# Patient Record
Sex: Male | Born: 1960 | Race: White | Hispanic: No | Marital: Married | Smoking: Never smoker
Health system: Southern US, Community
[De-identification: ages and names within clinical notes are randomized; demographics above are authoritative.]

## PROBLEM LIST (undated history)

## (undated) DIAGNOSIS — E785 Hyperlipidemia, unspecified: Secondary | ICD-10-CM

## (undated) DIAGNOSIS — R933 Abnormal findings on diagnostic imaging of other parts of digestive tract: Secondary | ICD-10-CM

## (undated) DIAGNOSIS — K621 Rectal polyp: Secondary | ICD-10-CM

## (undated) DIAGNOSIS — L570 Actinic keratosis: Secondary | ICD-10-CM

## (undated) DIAGNOSIS — N410 Acute prostatitis: Secondary | ICD-10-CM

## (undated) DIAGNOSIS — F411 Generalized anxiety disorder: Secondary | ICD-10-CM

## (undated) DIAGNOSIS — K620 Anal polyp: Secondary | ICD-10-CM

## (undated) DIAGNOSIS — C4490 Unspecified malignant neoplasm of skin, unspecified: Secondary | ICD-10-CM

## (undated) HISTORY — DX: Abnormal findings on diagnostic imaging of other parts of digestive tract: R93.3

## (undated) HISTORY — DX: Anal polyp: K62.0

## (undated) HISTORY — DX: Unspecified malignant neoplasm of skin, unspecified: C44.90

## (undated) HISTORY — DX: Acute prostatitis: N41.0

## (undated) HISTORY — DX: Hyperlipidemia, unspecified: E78.5

## (undated) HISTORY — DX: Actinic keratosis: L57.0

## (undated) HISTORY — DX: Rectal polyp: K62.1

## (undated) HISTORY — DX: Generalized anxiety disorder: F41.1

---

## 2001-12-08 HISTORY — PX: APPENDECTOMY: SHX54

## 2002-06-23 ENCOUNTER — Encounter (INDEPENDENT_AMBULATORY_CARE_PROVIDER_SITE_OTHER): Payer: Self-pay | Admitting: *Deleted

## 2002-06-23 ENCOUNTER — Encounter: Payer: Self-pay | Admitting: Family Medicine

## 2002-06-23 ENCOUNTER — Inpatient Hospital Stay (HOSPITAL_COMMUNITY): Admission: EM | Admit: 2002-06-23 | Discharge: 2002-06-25 | Payer: Self-pay | Admitting: *Deleted

## 2002-06-23 ENCOUNTER — Encounter: Admission: RE | Admit: 2002-06-23 | Discharge: 2002-06-23 | Payer: Self-pay | Admitting: Family Medicine

## 2004-06-20 ENCOUNTER — Emergency Department (HOSPITAL_COMMUNITY): Admission: EM | Admit: 2004-06-20 | Discharge: 2004-06-20 | Payer: Self-pay | Admitting: Emergency Medicine

## 2005-02-06 ENCOUNTER — Ambulatory Visit: Payer: Self-pay | Admitting: Gastroenterology

## 2005-02-20 ENCOUNTER — Ambulatory Visit: Payer: Self-pay | Admitting: Gastroenterology

## 2008-05-09 ENCOUNTER — Encounter: Payer: Self-pay | Admitting: Family Medicine

## 2009-03-06 ENCOUNTER — Ambulatory Visit: Payer: Self-pay | Admitting: Family Medicine

## 2009-03-06 LAB — CONVERTED CEMR LAB
Bilirubin Urine: NEGATIVE
Blood in Urine, dipstick: NEGATIVE
Glucose, Urine, Semiquant: NEGATIVE
Ketones, urine, test strip: NEGATIVE
Nitrite: NEGATIVE
Protein, U semiquant: NEGATIVE
Specific Gravity, Urine: 1.01
Urobilinogen, UA: 0.2
WBC Urine, dipstick: NEGATIVE
pH: 7

## 2009-03-07 LAB — CONVERTED CEMR LAB
ALT: 42 units/L (ref 0–53)
AST: 30 units/L (ref 0–37)
Albumin: 4.2 g/dL (ref 3.5–5.2)
Alkaline Phosphatase: 81 units/L (ref 39–117)
BUN: 9 mg/dL (ref 6–23)
Basophils Absolute: 0 10*3/uL (ref 0.0–0.1)
Basophils Relative: 0.2 % (ref 0.0–3.0)
Bilirubin, Direct: 0 mg/dL (ref 0.0–0.3)
CO2: 29 meq/L (ref 19–32)
Calcium: 9.3 mg/dL (ref 8.4–10.5)
Chloride: 105 meq/L (ref 96–112)
Cholesterol: 235 mg/dL — ABNORMAL HIGH (ref 0–200)
Creatinine, Ser: 0.8 mg/dL (ref 0.4–1.5)
Direct LDL: 169.1 mg/dL
Eosinophils Absolute: 0.1 10*3/uL (ref 0.0–0.7)
Eosinophils Relative: 1.9 % (ref 0.0–5.0)
GFR calc non Af Amer: 110.41 mL/min (ref >60)
Glucose, Bld: 101 mg/dL — ABNORMAL HIGH (ref 70–99)
HCT: 43.1 % (ref 39.0–52.0)
HDL: 46.4 mg/dL (ref >39.00)
Hemoglobin: 15 g/dL (ref 13.0–17.0)
Lymphocytes Relative: 47.4 % — ABNORMAL HIGH (ref 12.0–46.0)
Lymphs Abs: 2.8 10*3/uL (ref 0.7–4.0)
MCHC: 34.7 g/dL (ref 30.0–36.0)
MCV: 92.9 fL (ref 78.0–100.0)
Monocytes Absolute: 0.5 10*3/uL (ref 0.1–1.0)
Monocytes Relative: 9.3 % (ref 3.0–12.0)
Neutro Abs: 2.4 10*3/uL (ref 1.4–7.7)
Neutrophils Relative %: 41.2 % — ABNORMAL LOW (ref 43.0–77.0)
Platelets: 254 10*3/uL (ref 150.0–400.0)
Potassium: 4.1 meq/L (ref 3.5–5.1)
RBC: 4.64 M/uL (ref 4.22–5.81)
RDW: 11.4 % — ABNORMAL LOW (ref 11.5–14.6)
Sodium: 140 meq/L (ref 135–145)
TSH: 1.66 microintl units/mL (ref 0.35–5.50)
Total Bilirubin: 1.2 mg/dL (ref 0.3–1.2)
Total CHOL/HDL Ratio: 5
Total Protein: 7.4 g/dL (ref 6.0–8.3)
Triglycerides: 107 mg/dL (ref 0.0–149.0)
VLDL: 21.4 mg/dL (ref 0.0–40.0)
WBC: 5.8 10*3/uL (ref 4.5–10.5)

## 2009-03-16 ENCOUNTER — Ambulatory Visit: Payer: Self-pay | Admitting: Family Medicine

## 2009-03-19 DIAGNOSIS — J309 Allergic rhinitis, unspecified: Secondary | ICD-10-CM | POA: Insufficient documentation

## 2009-05-01 ENCOUNTER — Ambulatory Visit: Payer: Self-pay | Admitting: Family Medicine

## 2009-05-10 DIAGNOSIS — C4490 Unspecified malignant neoplasm of skin, unspecified: Secondary | ICD-10-CM

## 2009-05-10 DIAGNOSIS — E785 Hyperlipidemia, unspecified: Secondary | ICD-10-CM

## 2009-05-10 HISTORY — DX: Unspecified malignant neoplasm of skin, unspecified: C44.90

## 2009-05-10 HISTORY — DX: Hyperlipidemia, unspecified: E78.5

## 2009-10-12 ENCOUNTER — Ambulatory Visit: Payer: Self-pay | Admitting: Family Medicine

## 2009-10-12 DIAGNOSIS — F411 Generalized anxiety disorder: Secondary | ICD-10-CM | POA: Insufficient documentation

## 2009-10-12 DIAGNOSIS — M545 Low back pain, unspecified: Secondary | ICD-10-CM | POA: Insufficient documentation

## 2009-10-12 DIAGNOSIS — L570 Actinic keratosis: Secondary | ICD-10-CM

## 2009-10-12 HISTORY — DX: Generalized anxiety disorder: F41.1

## 2009-10-12 HISTORY — DX: Actinic keratosis: L57.0

## 2009-10-17 ENCOUNTER — Encounter: Admission: RE | Admit: 2009-10-17 | Discharge: 2009-10-17 | Payer: Self-pay | Admitting: Family Medicine

## 2009-12-13 ENCOUNTER — Ambulatory Visit: Payer: Self-pay | Admitting: Family Medicine

## 2010-01-29 ENCOUNTER — Encounter (INDEPENDENT_AMBULATORY_CARE_PROVIDER_SITE_OTHER): Payer: Self-pay | Admitting: *Deleted

## 2010-02-21 ENCOUNTER — Telehealth: Payer: Self-pay | Admitting: Family Medicine

## 2010-03-19 ENCOUNTER — Ambulatory Visit: Payer: Self-pay | Admitting: Family Medicine

## 2010-03-19 LAB — CONVERTED CEMR LAB
BUN: 7 mg/dL (ref 6–23)
Basophils Absolute: 0 10*3/uL (ref 0.0–0.1)
Chloride: 105 meq/L (ref 96–112)
Cholesterol: 249 mg/dL — ABNORMAL HIGH (ref 0–200)
Eosinophils Absolute: 0.2 10*3/uL (ref 0.0–0.7)
GFR calc non Af Amer: 109.92 mL/min (ref >60)
Glucose, Bld: 87 mg/dL (ref 70–99)
HCT: 42.8 % (ref 39.0–52.0)
HDL: 54 mg/dL (ref >39.00)
Ketones, urine, test strip: NEGATIVE
Lymphs Abs: 2.5 10*3/uL (ref 0.7–4.0)
MCV: 92.7 fL (ref 78.0–100.0)
Monocytes Absolute: 0.5 10*3/uL (ref 0.1–1.0)
Nitrite: NEGATIVE
Platelets: 254 10*3/uL (ref 150.0–400.0)
Potassium: 3.9 meq/L (ref 3.5–5.1)
RDW: 12.9 % (ref 11.5–14.6)
TSH: 2.48 microintl units/mL (ref 0.35–5.50)
Total Bilirubin: 0.7 mg/dL (ref 0.3–1.2)
Urobilinogen, UA: 0.2
VLDL: 25.6 mg/dL (ref 0.0–40.0)
WBC Urine, dipstick: NEGATIVE
pH: 6

## 2010-04-04 ENCOUNTER — Ambulatory Visit: Payer: Self-pay | Admitting: Family Medicine

## 2010-04-23 ENCOUNTER — Encounter (INDEPENDENT_AMBULATORY_CARE_PROVIDER_SITE_OTHER): Payer: Self-pay | Admitting: *Deleted

## 2010-05-16 ENCOUNTER — Encounter (INDEPENDENT_AMBULATORY_CARE_PROVIDER_SITE_OTHER): Payer: Self-pay

## 2010-05-21 ENCOUNTER — Ambulatory Visit: Payer: Self-pay | Admitting: Gastroenterology

## 2010-05-24 ENCOUNTER — Telehealth: Payer: Self-pay | Admitting: Family Medicine

## 2010-05-24 ENCOUNTER — Ambulatory Visit: Payer: Self-pay | Admitting: Family Medicine

## 2010-05-24 DIAGNOSIS — N410 Acute prostatitis: Secondary | ICD-10-CM | POA: Insufficient documentation

## 2010-05-24 HISTORY — DX: Acute prostatitis: N41.0

## 2010-05-24 LAB — CONVERTED CEMR LAB
Bilirubin Urine: NEGATIVE
Protein, U semiquant: NEGATIVE
Urobilinogen, UA: 0.2
WBC Urine, dipstick: NEGATIVE

## 2010-05-28 ENCOUNTER — Ambulatory Visit: Payer: Self-pay | Admitting: Family Medicine

## 2010-05-28 DIAGNOSIS — R197 Diarrhea, unspecified: Secondary | ICD-10-CM

## 2010-05-28 LAB — CONVERTED CEMR LAB
Bilirubin Urine: NEGATIVE
Ketones, urine, test strip: NEGATIVE
Urobilinogen, UA: 0.2
pH: 6

## 2010-05-29 ENCOUNTER — Telehealth: Payer: Self-pay | Admitting: Gastroenterology

## 2010-05-29 ENCOUNTER — Telehealth: Payer: Self-pay | Admitting: Family Medicine

## 2010-05-31 ENCOUNTER — Telehealth: Payer: Self-pay | Admitting: Gastroenterology

## 2010-06-18 ENCOUNTER — Ambulatory Visit: Payer: Self-pay | Admitting: Family Medicine

## 2010-06-18 DIAGNOSIS — K620 Anal polyp: Secondary | ICD-10-CM | POA: Insufficient documentation

## 2010-06-18 DIAGNOSIS — K621 Rectal polyp: Secondary | ICD-10-CM

## 2010-06-18 HISTORY — DX: Rectal polyp: K62.0

## 2010-06-19 ENCOUNTER — Telehealth: Payer: Self-pay | Admitting: Gastroenterology

## 2010-06-20 ENCOUNTER — Telehealth: Payer: Self-pay | Admitting: Gastroenterology

## 2010-06-20 ENCOUNTER — Ambulatory Visit: Payer: Self-pay | Admitting: Internal Medicine

## 2010-06-20 DIAGNOSIS — R933 Abnormal findings on diagnostic imaging of other parts of digestive tract: Secondary | ICD-10-CM

## 2010-06-20 HISTORY — DX: Abnormal findings on diagnostic imaging of other parts of digestive tract: R93.3

## 2010-06-25 ENCOUNTER — Ambulatory Visit: Payer: Self-pay | Admitting: Internal Medicine

## 2010-06-25 ENCOUNTER — Telehealth: Payer: Self-pay | Admitting: Internal Medicine

## 2010-06-26 ENCOUNTER — Encounter: Payer: Self-pay | Admitting: Internal Medicine

## 2010-07-02 LAB — HM COLONOSCOPY

## 2010-10-18 ENCOUNTER — Ambulatory Visit: Payer: Self-pay | Admitting: Family Medicine

## 2011-01-09 NOTE — Assessment & Plan Note (Signed)
Summary: painful hemmoroids/njr   Vital Signs:  Patient profile:   50 year old male Weight:      190 pounds Temp:     99.4 degrees F oral  Vitals Entered By: Sid Falcon LPN (October 18, 2010 3:13 PM)  History of Present Illness: Patient seen with acute issue of perianal pain which started approximately one week ago. Recent history is that he had a colonoscopy which was unremarkable with the exception of hyperplastic polyp. He had external hemorrhoidal skin tag which was removed by surgeon few months ago but healed uneventfully. He had one episode of bright red blood per rectum last week. Did have some preceding constipation. Pain is sharp and intermittent moderate at times. Improved slightly with tub bath.  Patient denies any diarrhea, abdominal pain, fever.  Allergies: No Known Drug Allergies  Past History:  Past Medical History: Last updated: 06/20/2010 Allergic rhinitis basal cell cancer of chest wall Hyperlipidemia Anxiety Disorder PMH reviewed for relevance  Physical Exam  General:  Well-developed,well-nourished,in no acute distress; alert,appropriate and cooperative throughout examination Lungs:  Normal respiratory effort, chest expands symmetrically. Lungs are clear to auscultation, no crackles or wheezes. Heart:  Normal rate and regular rhythm. S1 and S2 normal without gallop, murmur, click, rub or other extra sounds. Rectal:  no evidence for external skin tags. Patient has fairly large fissure at 12:00 position with no active bleeding. This is tender to palpation. No evidence for perianal abscess   Impression & Recommendations:  Problem # 1:  ANAL FISSURE (ICD-565.0) Assessment New continue with tub baths, measures to reduce constipation, and addition of diltiazem gel or diluted nitroglycerin with Vaseline for symptom relief if pain persists or worsens  Complete Medication List: 1)  Aleve 220 Mg Caps (Naproxen sodium) .... Prn 2)  Clonazepam 0.5 Mg Tbdp  (Clonazepam) .... One by mouth two times a day as needed 3)  Xylocaine Jelly 2 % Gel (Lidocaine hcl) .... Use on rectal area up to 3 times daily  Patient Instructions: 1)  You have an anal fissure 2)  Drink lots of fluids and fiber. 3)  Stool softener such as colace or senokot s 2 daily. 4)  Continue warm tub baths.   Orders Added: 1)  Est. Patient Level III [60454]

## 2011-01-09 NOTE — Progress Notes (Signed)
Summary: Procedure Change  Phone Note Other Incoming   Caller: Dr.Moore Summary of Call: Dr.Moore called Dr.Brodie about a patient of Dr.Kaplans that was seen by Willette Cluster NP this morning. Apparently this pt. is extremely anxious, cannot wait until 07-02-10 for Colonoscopy, per Dr.Moore. Dr.Moore requests that pt. procedure be moved to ASAP. Dr.Nehemiah Mcfarren is out of the office 06-24-10 through 07-02-10, so Dr.Brodie has offered to do the procedure on 06-25-10 at 10am in [facility]. All instructions updated w/pt. by phone. Pt. advised that he will remain a pt. of Dr.Freeman Borba for all future GI needs, he agrees.  Initial call taken by: Laureen Ochs LPN,  June 20, 2010 4:40 PM  Follow-up for Phone Call        ok Follow-up by: Louis Meckel MD,  June 21, 2010 9:51 AM

## 2011-01-09 NOTE — Assessment & Plan Note (Signed)
Summary: UTI?dm   Vital Signs:  Patient profile:   50 year old male Weight:      188 pounds (85.45 kg) O2 Sat:      95 % Temp:     98.0 degrees F (36.67 degrees C) oral Pulse rate:   125 / minute BP sitting:   120 / 80  (right arm) Cuff size:   regular  Vitals Entered By: Kathrynn Speed CMA (May 28, 2010 3:19 PM) CC: prostatitis problems, urgency for one week, nausesa, pressure Is Patient Diabetic? No   History of Present Illness: Patient in today with persistent lower abdominal and lower back pain and pressure. Notes no change in his urinary frequency or urgency. He works outside on heavy equipment and he notes increased pain/urgency when he is bouncing around on machinery. No f/c/myalgias/fatigue. Does occasionally have some hesitancy but no significant dysuria. He was started on Cipro on Friday and does note no change in symptoms. He says the symptoms feel like when he had prostatitis years ago. He also notes some low grade nausea for over a week and an increase in loose stool since starting Cipro. 2-3 loose stool with some mild gaseousness, no bloody or tarry stool. Denies any CP/palp/SOB.  Current Medications (verified): 1)  Aleve 220 Mg Caps (Naproxen Sodium) .... Prn 2)  Clonazepam 0.5 Mg Tbdp (Clonazepam) .... One By Mouth Two Times A Day As Needed 3)  Ciprofloxacin Hcl 500 Mg Tabs (Ciprofloxacin Hcl) .... One By Mouth Two Times A Day For 10 Days 4)  Urogesic-Blue 81.6 Mg Tabs (Methen-Hyosc-Meth Blue-Na Phos) .... One Tab 4 Times A Day Prn  Allergies (verified): No Known Drug Allergies  Past History:  Past medical history reviewed for relevance to current acute and chronic problems. Social history (including risk factors) reviewed for relevance to current acute and chronic problems.  Past Medical History: Reviewed history from 05/10/2009 and no changes required. Allergic rhinitis basal cell cancer of chest wall Hyperlipidemia  Social History: Reviewed history from  03/16/2009 and no changes required. Occupation: Administrator,  Personal assistant Married Never Smoked Alcohol use-yes  Review of Systems      See HPI  Physical Exam  General:  Well-developed,well-nourished,in no acute distress; alert,appropriate and cooperative throughout examination Head:  Normocephalic and atraumatic without obvious abnormalities. No apparent alopecia or balding. Mouth:  Oral mucosa and oropharynx without lesions or exudates Lungs:  Normal respiratory effort, chest expands symmetrically. Lungs are clear to auscultation, no crackles or wheezes. Heart:  Normal rate and regular rhythm. S1 and S2 normal without gallop, murmur, click, rub or other extra sounds. Abdomen:  Bowel sounds positive,abdomen soft and non-tender without masses, organomegaly or hernias noted. Extremities:  No clubbing, cyanosis, edema, or deformity noted with normal full range of motion of all joints.   Psych:  Cognition and judgment appear intact. Alert and cooperative with normal attention span and concentration. No apparent delusions, illusions, hallucinations   Impression & Recommendations:  Problem # 1:  ACUTE PROSTATITIS (ICD-601.0) Will switch to Levaquin 500mg  once daily from Cipro. Use Naproxen 500mg  two times a day for pain, maintain increased hydration. Report if symptoms worsen to consider urologic referral.  Problem # 2:  DIARRHEA (ICD-787.91) Start Align probiotic daily and maintain adequate dietary fiber and increased hydration  Complete Medication List: 1)  Aleve 220 Mg Caps (Naproxen sodium) .... Prn 2)  Clonazepam 0.5 Mg Tbdp (Clonazepam) .... One by mouth two times a day as needed 3)  Urogesic-blue 81.6 Mg Tabs (Methen-hyosc-meth blue-na  phos) .... One tab 4 times a day prn 4)  Levaquin 500 Mg Tabs (Levofloxacin) .Marland Kitchen.. 1 tab by mouth daily x 14 days 5)  Naproxen 500 Mg Tabs (Naproxen) .Marland Kitchen.. 1 tab by mouth two times a day as needed for pain  Patient Instructions: 1)  Please schedule  a follow-up appointment as needed if symptoms worsen or do not resolve. Push clear fluids over the next few days, minimize caffeine and alcohol over the next few days. Use Naproxen as needed, start Align probiotic daily for next month. Prescriptions: NAPROXEN 500 MG TABS (NAPROXEN) 1 tab by mouth two times a day as needed for pain  #60 x 1   Entered and Authorized by:   Danise Edge MD   Signed by:   Danise Edge MD on 05/28/2010   Method used:   Faxed to ...       [facility]ospital doctor (retail)       [address]       [place], [place]  [address]       Ph: [phone] or [phone]       Fax: [phone]   RxID:   [id number] LEVAQUIN 500 MG TABS (LEVOFLOXACIN) 1 tab by mouth daily x 14 days  #14 x 0   Entered and Authorized by:   Danise Edge MD   Signed by:   Danise Edge MD on 05/28/2010   Method used:   Faxed to ...       Hospital doctor (retail)       [address]       [place], [place]  [address]       Ph: [phone] or [phone]       Fax: [phone]   RxID:   [id number]   Laboratory Results   Urine Tests  Date/Time Received: Kathrynn Speed [facility]  May 28, 2010 3:29  Date/Time Reported: Kathrynn Speed CMA  June 21, 3:29 PM   Routine Urinalysis   Color: lt. yellow Appearance: Clear Glucose: negative   (Normal Range: Negative) Bilirubin: negative   (Normal Range: Negative) Ketone: negative   (Normal Range: Negative) Spec. Gravity: 1.000   (Normal Range: 1.003-1.035) Blood: negative   (Normal Range: Negative) pH: 6.0   (Normal Range: 5.0-8.0) Protein: negative   (Normal Range: Negative) Urobilinogen: 0.2   (Normal Range: 0-1) Nitrite: negative   (Normal Range: Negative) Leukocyte Esterace: negative   (Normal Range: Negative)

## 2011-01-09 NOTE — Progress Notes (Signed)
Summary: ? strept throat  Phone Note Call from Patient   Caller: Patient Call For: Evelena Peat MD Reason for Call: Acute Illness Summary of Call: Pt's 2 daughters have strept throat, and he is starting with low grade fever and sore throat.  Is asking if Dr. Caryl Never will call in RX for him without an office visit? [phone] Initial call taken by: Lynann Beaver CMA,  February 21, 2010 2:44 PM  Follow-up for Phone Call        Given that hx calling in is reasonable.  Start Amoxicillin 875 mg by mouth two times a day for 10 days. Follow-up by: Evelena Peat MD,  February 21, 2010 3:37 PM    New/Updated Medications: AMOXICILLIN 875 MG TABS (AMOXICILLIN) one by mouth two times a day x 10 days Prescriptions: AMOXICILLIN 875 MG TABS (AMOXICILLIN) one by mouth two times a day x 10 days  #20 x 0   Entered by:   Lynann Beaver CMA   Authorized by:   Evelena Peat MD   Signed by:   Lynann Beaver CMA on 02/21/2010   Method used:   Faxed to ...       Hospital doctor (retail)       [address]       [place], [place]  [address]       Ph: [phone] or [phone]       Fax: [phone]   RxID:   [id number]  Pt. notified.

## 2011-01-09 NOTE — Assessment & Plan Note (Signed)
Summary: chest congestion/cough/difficulty swallowing/per Dr. Sander Radon...   Vital Signs:  Patient profile:   50 year old male Temp:     98.7 degrees F oral BP sitting:   140 / 92  (left arm) Cuff size:   large  Vitals Entered By: Sid Falcon LPN (December 13, 2009 3:55 PM) CC: Chest congestion, difficulty swallowing   History of Present Illness: Acute visit. Three-week history of chest congestion and cough. Mostly nonproductive. No sore throat. No body aches or fever. Has had some intermittent nasal congestion. Nonsmoker. No obvious wheezing. Cough worse at night. No relief with over-the-counter medications.  Allergies (verified): No Known Drug Allergies  Past History:  Past Medical History: Last updated: 05/10/2009 Allergic rhinitis basal cell cancer of chest wall Hyperlipidemia  Review of Systems      See HPI  Physical Exam  General:  Well-developed,well-nourished,in no acute distress; alert,appropriate and cooperative throughout examination Eyes:  No corneal or conjunctival inflammation noted. EOMI. Perrla. Funduscopic exam benign, without hemorrhages, exudates or papilledema. Vision grossly normal. Ears:  External ear exam shows no significant lesions or deformities.  Otoscopic examination reveals clear canals, tympanic membranes are intact bilaterally without bulging, retraction, inflammation or discharge. Hearing is grossly normal bilaterally. Nose:  External nasal examination shows no deformity or inflammation. Nasal mucosa are pink and moist without lesions or exudates. Mouth:  Oral mucosa and oropharynx without lesions or exudates.  Teeth in good repair. Neck:  No deformities, masses, or tenderness noted. Lungs:  Normal respiratory effort, chest expands symmetrically. Lungs are clear to auscultation, no crackles or wheezes. Heart:  Normal rate and regular rhythm. S1 and S2 normal without gallop, murmur, click, rub or other extra sounds.   Impression &  Recommendations:  Problem # 1:  ACUTE BRONCHITIS (ICD-466.0)  Given duration of symptoms will treat with antibiotics and cough suppressant as needed.  His updated medication list for this problem includes:    Azithromycin 250 Mg Tabs (Azithromycin) .Marland Kitchen... 2 by mouth today then one by mouth once daily for 4 days.    Hydrocodone-homatropine 5-1.5 Mg/30ml Syrp (Hydrocodone-homatropine) ..... One tsp by mouth q 4-6 hours as needed cough  Complete Medication List: 1)  Aleve 220 Mg Caps (Naproxen sodium) .... Prn 2)  Clonazepam 0.5 Mg Tbdp (Clonazepam) .... One by mouth two times a day as needed 3)  Azithromycin 250 Mg Tabs (Azithromycin) .... 2 by mouth today then one by mouth once daily for 4 days. 4)  Hydrocodone-homatropine 5-1.5 Mg/82ml Syrp (Hydrocodone-homatropine) .... One tsp by mouth q 4-6 hours as needed cough  Patient Instructions: 1)  Call or be in touch with him next couple weeks if cough not improving. 2)  Continue over-the-counter Mucinex. Prescriptions: HYDROCODONE-HOMATROPINE 5-1.5 MG/5ML SYRP (HYDROCODONE-HOMATROPINE) one tsp by mouth q 4-6 hours as needed cough  #120 ml x 0   Entered and Authorized by:   Evelena Peat MD   Signed by:   Evelena Peat MD on 12/13/2009   Method used:   Print then Give to Patient   RxID:   [id number] AZITHROMYCIN 250 MG TABS (AZITHROMYCIN) 2 by mouth today then one by mouth once daily for 4 days.  #6 x 0   Entered and Authorized by:   Evelena Peat MD   Signed by:   Evelena Peat MD on 12/13/2009   Method used:   Print then Give to Patient   RxID:   [id number]

## 2011-01-09 NOTE — Progress Notes (Signed)
Summary: talk to Harriett Sine, colonscopy question  Phone Note Call from Patient   Caller: Patient Call For: Evelena Peat MD Summary of Call: Pt would like to talk to Dr. Lucie Leather nurse regarding his recent office visit. [phone]  Initial call taken by: Lynann Beaver CMA,  May 29, 2010 10:13 AM  Follow-up for Phone Call        Pt had questions about colonscopy next week, on meds should he follow-thru.  Suggested pt call GI office for answer to his questions. Follow-up by: Sid Falcon LPN,  May 29, 2010 12:08 PM

## 2011-01-09 NOTE — Letter (Signed)
Summary: [facility] Instructions  [facility]  [address] [place], [place] [address]   Phone: [phone]  Fax: [phone]       [name]    Apr 22, 1947    MRN: [id number]        Procedure Day Dorna Bloom:  Jake Shark  06/04/10     Arrival Time:  7:30AM      Procedure Time:  8:00AM     Location of Procedure:                    _ X_  [facility] (4th Floor)                       PREPARATION FOR COLONOSCOPY WITH MOVIPREP   Starting 5 days prior to your procedure 05/30/10 do not eat nuts, seeds, popcorn, corn, beans, peas,  salads, or any raw vegetables.  Do not take any fiber supplements (e.g. Metamucil, Citrucel, and Benefiber).  THE DAY BEFORE YOUR PROCEDURE         DATE: 06/03/10  DAY: MONDAY  1.  Drink clear liquids the entire day-NO SOLID FOOD  2.  Do not drink anything colored red or purple.  Avoid juices with pulp.  No orange juice.  3.  Drink at least 64 oz. (8 glasses) of fluid/clear liquids during the day to prevent dehydration and help the prep work efficiently.  CLEAR LIQUIDS INCLUDE: Water Jello Ice Popsicles Tea (sugar ok, no milk/cream) Powdered fruit flavored drinks Coffee (sugar ok, no milk/cream) Gatorade Juice: apple, white grape, white cranberry  Lemonade Clear bullion, consomm, broth Carbonated beverages (any kind) Strained chicken noodle soup Hard Candy                             4.  In the morning, mix first dose of MoviPrep solution:    Empty 1 Pouch A and 1 Pouch B into the disposable container    Add lukewarm drinking water to the top line of the container. Mix to dissolve    Refrigerate (mixed solution should be used within 24 hrs)  5.  Begin drinking the prep at 5:00 p.m. The MoviPrep container is divided by 4 marks.   Every 15 minutes drink the solution down to the next mark (approximately 8 oz) until the full liter is complete.   6.  Follow completed prep with 16 oz of clear liquid of your choice (Nothing  red or purple).  Continue to drink clear liquids until bedtime.  7.  Before going to bed, mix second dose of MoviPrep solution:    Empty 1 Pouch A and 1 Pouch B into the disposable container    Add lukewarm drinking water to the top line of the container. Mix to dissolve    Refrigerate  THE DAY OF YOUR PROCEDURE      DATE: 06/04/10  DAY: TUESDAY  Beginning at 3:00AM (5 hours before procedure):         1. Every 15 minutes, drink the solution down to the next mark (approx 8 oz) until the full liter is complete.  2. Follow completed prep with 16 oz. of clear liquid of your choice.    3. You may drink clear liquids until 6:00AM (2 HOURS BEFORE PROCEDURE).   MEDICATION INSTRUCTIONS  Unless otherwise instructed, you should take regular prescription medications with a small sip of water   as early as possible the morning  of your procedure.         OTHER INSTRUCTIONS  You will need a responsible adult at least 49 years of age to accompany you and drive you home.   This person must remain in the waiting room during your procedure.  Wear loose fitting clothing that is easily removed.  Leave jewelry and other valuables at home.  However, you may wish to bring a book to read or  an iPod/MP3 player to listen to music as you wait for your procedure to start.  Remove all body piercing jewelry and leave at home.  Total time from sign-in until discharge is approximately 2-3 hours.  You should go home directly after your procedure and rest.  You can resume normal activities the  day after your procedure.  The day of your procedure you should not:   Drive   Make legal decisions   Operate machinery   Drink alcohol   Return to work  You will receive specific instructions about eating, activities and medications before you leave.    The above instructions have been reviewed and explained to me by   Ulis Rias RN  May 21, 2010 1:50 PM     I fully understand and can  verbalize these instructions _____________________________ Date _________

## 2011-01-09 NOTE — Consult Note (Signed)
Summary: [facility]   Imported By: Sherian Rein 07/15/2010 11:25:04  _____________________________________________________________________  External Attachment:    Type:   Image     Comment:   External Document

## 2011-01-09 NOTE — Progress Notes (Signed)
Summary: Urised no longer available, alternate RX?  Phone Note From Pharmacy   Caller: [facility] [employer] For: [employer] of Call: Pharmacist from [employer] the Urised 2 tabs qid as needed #60 is no longer available (written Rx given at [facility] today) The do have Urogesic Blue, simular but different and 2 times as strong.  Pharmacist recomending one tab qid #30 Initial call taken by: Sid Falcon LPN,  May 24, 2010 4:43 PM  Follow-up for Phone Call        OK to substitute. Follow-up by: Evelena Peat MD,  May 24, 2010 5:23 PM    New/Updated Medications: UROGESIC-BLUE 81.6 MG TABS (METHEN-HYOSC-METH BLUE-NA PHOS) one tab 4 times a day prn Prescriptions: UROGESIC-BLUE 81.6 MG TABS (METHEN-HYOSC-METH BLUE-NA PHOS) one tab 4 times a day prn  #30 x 0   Entered by:   Sid Falcon LPN   Authorized by:   Evelena Peat MD   Signed by:   Sid Falcon LPN on 16/09/9603   Method used:   Telephoned to ...       Hospital doctor (retail)       [address]       [place], [place]  [address]       Ph: [phone] or [phone]       Fax: [phone]   RxID:   [id number]

## 2011-01-09 NOTE — Assessment & Plan Note (Signed)
Summary: ?hemorroids/njr   Vital Signs:  Patient profile:   50 year old male Weight:      190 pounds Temp:     97.9 degrees F oral BP sitting:   142 / 98  (left arm) Cuff size:   regular  Vitals Entered By: Kern Reap CMA Duncan Dull) (June 18, 2010 12:16 PM) CC: possible hemorroids   CC:  possible hemorroids.  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  He has had  this problem.  The past.  It resolves spontaneously.  Now.  He has two lesions that are protruding.  He stated have a colonoscopy in August by Dr. Arlyce Dice.  the lesions areSlightly painful, and not bleeding  Allergies: No Known Drug Allergies  Past History:  Past medical, surgical, family and social histories (including risk factors) reviewed for relevance to current acute and chronic problems.  Past Medical History: Reviewed history from 05/10/2009 and no changes required. Allergic rhinitis basal cell cancer of chest wall Hyperlipidemia  Past Surgical History: Reviewed history from 05/10/2009 and no changes required. Appendectomy--2003  Family History: Reviewed history from 04/04/2010 and no changes required. Family History of Colon CA 1st degree relative <60 Mother Family History Diabetes 1st degree relative  Father Prostate Ca grandfather Purtscher disease--mother  Social History: Reviewed history from 03/16/2009 and no changes required. 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  There are two lesions protruding from the rectum.  elongated  skin tags   Impression & Recommendations:  Problem # 1:  ANAL AND RECTAL POLYP  (ICD-569.0) Assessment New  Orders: Gastroenterology Referral (GI)  Complete Medication List: 1)  Aleve 220 Mg Caps (Naproxen sodium) .... Prn 2)  Clonazepam 0.5 Mg Tbdp (Clonazepam) .... One by mouth two times a day as needed  Patient Instructions: 1)  we will put in a call to Dr. Marzetta Board office

## 2011-01-09 NOTE — Miscellaneous (Signed)
Summary: [facility] previsit  Clinical Lists Changes  Medications: Added new medication of MOVIPREP 100 GM  SOLR (PEG-KCL-NACL-NASULF-NA ASC-C) As per prep instructions. - Signed Rx of MOVIPREP 100 GM  SOLR (PEG-KCL-NACL-NASULF-NA ASC-C) As per prep instructions.;  #1 x 0;  Signed;  Entered by: Ulis Rias RN;  Authorized by: Louis Meckel MD;  Method used: Print then Give to Patient Observations: Added new observation of NKA: T (05/21/2010 12:44)    Prescriptions: MOVIPREP 100 GM  SOLR (PEG-KCL-NACL-NASULF-NA ASC-C) As per prep instructions.  #1 x 0   Entered by:   Ulis Rias RN   Authorized by:   Louis Meckel MD   Signed by:   Ulis Rias RN on 05/21/2010   Method used:   Print then Give to Patient   RxID:   [id number]

## 2011-01-09 NOTE — Progress Notes (Signed)
Summary: cx proc  Phone Note Call from Patient Call back at Home Phone [phone]   Caller: Patient Call For: Arlyce Dice Summary of Call: Patient rescheduled his procedure that was scheduled for Tuesday at 8:00 because he was put on an antibiotic and has "terrible stomach pain". Patient rescheduled for 7-26.  will you charge? Initial call taken by: Tawni Levy,  May 31, 2010 10:56 AM  Follow-up for Phone Call        no Follow-up by: Louis Meckel MD,  June 03, 2010 8:37 AM  Additional Follow-up for Phone Call Additional follow up Details #1::        Patient NOT BILLED. Additional Follow-up by: Leanor Kail [facility],  June 03, 2010 12:12 PM

## 2011-01-09 NOTE — Progress Notes (Signed)
Summary: Surgical Consult Scheduled  Phone Note Other Incoming   Caller: DR.BRODIE Summary of Call: Per Dr.Brodie, pt. will see Dr.Weatherly at [facility] on 06-26-10 at 2:45pm for rectal skin tags. All records faxed to Dr.Weatherly. Appt. information given to [facility] in [facility] recovery, to give to pt. and his responsible party. Pt. instructed to call back as needed.  Initial call taken by: Laureen Ochs LPN,  June 25, 2010 11:09 AM

## 2011-01-09 NOTE — Letter (Signed)
Summary: Colonoscopy Letter  [facility]  [address] [place], [place] [address]   Phone: [phone]  Fax: [phone]      January 29, 2010 MRN: [id number]   MACK ALVIDREZ [address] [place], [place]  [address]   Dear Mr. Oakley,   According to your medical record, it is time for you to schedule a Colonoscopy. The American Cancer Society recommends this procedure as a method to detect early colon cancer. Patients with a family history of colon cancer, or a personal history of colon polyps or inflammatory bowel disease are at increased risk.  This letter has beeen generated based on the recommendations made at the time of your procedure. If you feel that in your particular situation this may no longer apply, please contact our office.  Please call our office at [phone] to schedule this appointment or to update your records at your earliest convenience.  Thank you for cooperating with [place] to provide you with the very best care possible.   Sincerely,  Barbette Hair. Arlyce Dice, M.D.  [facility] [phone]

## 2011-01-09 NOTE — Assessment & Plan Note (Signed)
Summary: cpx/njr/pt rescd///ccm   Vital Signs:  Patient profile:   50 year old male Height:      70 inches Weight:      191 pounds BMI:     27.50 Temp:     97.8 degrees F oral Pulse rate:   72 / minute Pulse rhythm:   regular Resp:     12 per minute BP sitting:   132 / 82  (left arm) Cuff size:   regular  Vitals Entered By: Sid Falcon LPN (April 04, 2010 10:28 AM)  Nutrition Counseling: Patient's BMI is greater than 25 and therefore counseled on weight management options. CC: CPX, labs done   History of Present Illness: Patient here for complete physical examination.  Overall doing well. 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 His last some weight since last year. Due for repeat colonoscopy. Positive family history of colon cancer his mother. Last colonoscopy 5 years ago.  Tetanus up to date. Patient takes no regular medications.  Past medical history, social history, and family history reviewed and no significant changes.  Preventive Screening-Counseling & Management  Alcohol-Tobacco     Smoking Status: never  Allergies (verified): No Known Drug Allergies  Past History:  Past Medical History: Last updated: 05/10/2009 Allergic rhinitis basal cell cancer of chest wall Hyperlipidemia  Past Surgical History: Last updated: 05/10/2009 Appendectomy--2003  Family History: Last updated: 04/04/2010 Family History of Colon CA 1st degree relative <60 Mother Family History Diabetes 1st degree relative  Father Prostate Ca grandfather Purtscher disease--mother  Social History: Last updated: 03/16/2009 Occupation: Administrator,  winery owner Married Never Smoked Alcohol use-yes  Risk Factors: Smoking Status: never (04/04/2010)  Family History: Family History of Colon CA 1st degree relative <60 Mother Family History Diabetes 1st degree relative  Father Prostate Ca grandfather Purtscher disease--mother  Review of Systems  The patient denies anorexia,  fever, weight loss, weight gain, vision loss, decreased hearing, hoarseness, chest pain, syncope, dyspnea on exertion, peripheral edema, prolonged cough, headaches, hemoptysis, abdominal pain, melena, hematochezia, severe indigestion/heartburn, hematuria, incontinence, genital sores, muscle weakness, suspicious skin lesions, transient blindness, difficulty walking, depression, unusual weight change, abnormal bleeding, enlarged lymph nodes, and testicular masses.    Physical Exam  General:  Well-developed,well-nourished,in no acute distress; alert,appropriate and cooperative throughout examination Head:  Normocephalic and atraumatic without obvious abnormalities. No apparent alopecia or balding. Eyes:  No corneal or conjunctival inflammation noted. EOMI. Perrla. Funduscopic exam benign, without hemorrhages, exudates or papilledema. Vision grossly normal. Ears:  External ear exam shows no significant lesions or deformities.  Otoscopic examination reveals clear canals, tympanic membranes are intact bilaterally without bulging, retraction, inflammation or discharge. Hearing is grossly normal bilaterally. Mouth:  Oral mucosa and oropharynx without lesions or exudates.  Teeth in good repair. Neck:  No deformities, masses, or tenderness noted. Lungs:  Normal respiratory effort, chest expands symmetrically. Lungs are clear to auscultation, no crackles or wheezes. Heart:  Normal rate and regular rhythm. S1 and S2 normal without gallop, murmur, click, rub or other extra sounds. Abdomen:  Bowel sounds positive,abdomen soft and non-tender without masses, organomegaly or hernias noted. Genitalia:  Testes bilaterally descended without nodularity, tenderness or masses. No scrotal masses or lesions. No penis lesions or urethral discharge. Extremities:  No clubbing, cyanosis, edema, or deformity noted with normal full range of motion of all joints.   Neurologic:  No cranial nerve deficits noted. Station and gait are  normal. Plantar reflexes are down-going bilaterally. DTRs are symmetrical throughout. Sensory, motor and coordinative functions  appear intact. Skin:  Intact without suspicious lesions or rashes Cervical Nodes:  No lymphadenopathy noted Psych:  normally interactive, good eye contact, not anxious appearing, and not depressed appearing.     Impression & Recommendations:  Problem # 1:  PHYSICAL EXAMINATION (ICD-V70.0)  we'll schedule repeat colonoscopy. Labs reviewed with patient. He has history of hyperlipidemia and we discussed pros and cons of treatment but overall very low risk for CAD.  Orders: Gastroenterology Referral (GI)  Complete Medication List: 1)  Aleve 220 Mg Caps (Naproxen sodium) .... Prn 2)  Clonazepam 0.5 Mg Tbdp (Clonazepam) .... One by mouth two times a day as needed

## 2011-01-09 NOTE — Letter (Signed)
Summary: Patient Notice- Polyp Results  [facility]  [address] [place], [place] [address]   Phone: [phone]  Fax: [phone]        June 26, 2010 MRN: [id number]    [name] [address] [place], [place]  [address]    Dear Mr. Mcever,  I am pleased to inform you that the colon polyp(s) removed during your recent colonoscopy was (were) found to be benign (no cancer detected) upon pathologic examination.The polyps were hyperplastic ( not precancerous).  I recommend you have a repeat colonoscopy examination in 5_ years with Dr Arlyce Dice  to look for recurrent polyps, as having colon polyps increases your risk for having recurrent polyps or even colon cancer in the future.  Should you develop new or worsening symptoms of abdominal pain, bowel habit changes or bleeding from the rectum or bowels, please schedule an evaluation with either your primary care physician or with me.  Additional information/recommendations:  _x_ No further action with gastroenterology is needed at this time. Please      follow-up with your primary care physician for your other healthcare      needs.  __ Please call [phone] to schedule a return visit to review your      situation.  __ Please keep your follow-up visit as already scheduled.  __ Continue treatment plan as outlined the day of your exam.  Please call us if you are having persistent problems or have questions about your condition that have not been fully answered at this time.  Sincerely,  Hart Carwin MD  This letter has been electronically signed by your physician.  Appended Document: Patient Notice- Polyp Results letter mailed

## 2011-01-09 NOTE — Progress Notes (Signed)
Summary: ? re meds before proc  Phone Note Call from Patient Call back at [phone]   Caller: Patient Reason for Call: Talk to Nurse Summary of Call: Patient wants to speak to nurse regarding prescriptions he's taking and he's scheduled for a colon on Tues. Initial call taken by: Tawni Levy,  May 29, 2010 2:28 PM  Follow-up for Phone Call       Follow-up by: Wyona Almas RN,  May 29, 2010 5:29 PM

## 2011-01-09 NOTE — Assessment & Plan Note (Signed)
Summary: UTI/dm   Vital Signs:  Patient profile:   50 year old male Temp:     98 degrees F oral BP sitting:   130 / 82  (left arm) Cuff size:   regular  Vitals Entered By: Sid Falcon LPN (May 24, 2010 1:12 PM) CC: burning with urination   History of Present Illness: Patient seen with one week history of some mild discomfort with urination. Burning-type backache lower lumbar region. Some frequency of urination. No penile discharge. Denies fever or chills. Similar symptoms previously with prostatitis.  No rashes.  Allergies (verified): No Known Drug Allergies  Past History:  Past Medical History: Last updated: 05/10/2009 Allergic rhinitis basal cell cancer of chest wall Hyperlipidemia  Review of Systems  The patient denies anorexia, fever, abdominal pain, hematuria, incontinence, and suspicious skin lesions.    Physical Exam  General:  Well-developed,well-nourished,in no acute distress; alert,appropriate and cooperative throughout examination Genitalia:  Testes bilaterally descended without nodularity, tenderness or masses. No scrotal masses or lesions. No penis lesions or urethral discharge. Prostate:  prostate is moderately swollen and tender to palpation.  No nodules Skin:  no rashes.     Impression & Recommendations:  Problem # 1:  ACUTE PROSTATITIS (ICD-601.0) Start Cipro.  Complete Medication List: 1)  Aleve 220 Mg Caps (Naproxen sodium) .... Prn 2)  Clonazepam 0.5 Mg Tbdp (Clonazepam) .... One by mouth two times a day as needed 3)  Ciprofloxacin Hcl 500 Mg Tabs (Ciprofloxacin hcl) .... One by mouth two times a day for 10 days  Patient Instructions: 1)  Drink plenty of fluids. 2)  Take your antibiotic as prescribed until ALL of it is gone, but stop if you develop a rash or swelling and contact our office as soon as possible.  Prescriptions: CIPROFLOXACIN HCL 500 MG TABS (CIPROFLOXACIN HCL) one by mouth two times a day for 10 days  #20 x 0   Entered and  Authorized by:   Evelena Peat MD   Signed by:   Evelena Peat MD on 05/24/2010   Method used:   Faxed to ...       [facility]       [address]       [place], [place]  [address]       Ph: [phone] or [phone]       Fax: [phone]   RxID:   [id number]   Laboratory Results   Urine Tests    Routine Urinalysis   Color: lt. yellow Appearance: Clear Glucose: negative   (Normal Range: Negative) Bilirubin: negative   (Normal Range: Negative) Ketone: negative   (Normal Range: Negative) Spec. Gravity: <1.005   (Normal Range: 1.003-1.035) Blood: negative   (Normal Range: Negative) pH: 6.0   (Normal Range: 5.0-8.0) Protein: negative   (Normal Range: Negative) Urobilinogen: 0.2   (Normal Range: 0-1) Nitrite: negative   (Normal Range: Negative) Leukocyte Esterace: negative   (Normal Range: Negative)    Comments: Sid Falcon LPN  May 24, 2010 1:22 PM

## 2011-01-09 NOTE — Procedures (Signed)
Summary: LEC COLON   Colonoscopy  Procedure date:  02/20/2005  Findings:      Location:  [facility].   Patient Name: [name] MRN:  Procedure Procedures: Colorectal cancer screening, high risk CPT: G0105.  Personnel: Endoscopist: Barbette Hair. Arlyce Dice, MD.  Patient Consent: Procedure, Alternatives, Risks and Benefits discussed, consent obtained, from patient.  Indications  Increased Risk Screening: For family history of colorectal neoplasia, in  parent  History  Current Medications: Patient is not currently taking Coumadin.  Pre-Exam Physical: Performed Feb 20, 2005. Cardio-pulmonary exam, HEENT exam , Abdominal exam WNL.  Exam Exam: Extent of exam reached: Cecum, extent intended: Cecum.  The cecum was identified by IC valve. Colon retroflexion performed. ASA Classification: I. Tolerance: good.  Monitoring: Pulse and BP monitoring, Oximetry used. Supplemental O2 given. at 2 Liters.  Colon Prep Used Miralax for colon prep. Prep results: excellent.  Sedation Meds: Patient assessed and found to be appropriate for moderate (conscious) sedation. Sedation was managed by the Endoscopist. Fentanyl 125 mcg. given IV. Versed 12 given IV.  Findings - NORMAL EXAM: Cecum to Rectum.  NORMAL EXAM: Cecum.  NORMAL EXAM: Rectum.   Assessment Normal examination.  Events  Unplanned Interventions: No intervention was required.  Unplanned Events: There were no complications. Plans  Post Exam Instructions: Post sedation instructions given.  Patient Education: Patient given standard instructions for: a normal exam.  Scheduling/Referral: Colonoscopy, to Molly Maduro D. Arlyce Dice, MD, around Feb 20, 2010.    cc: Evelena Peat, MD  This report was created from the original endoscopy report, which was reviewed and signed by the above listed endoscopist.

## 2011-01-09 NOTE — Procedures (Signed)
Summary: Colonoscopy  Patient: [name] Note: All result statuses are Final unless otherwise noted.  Tests: (1) Colonoscopy (COL)   COL Colonoscopy           DONE     [facility]     [address]     [place], [place]  [address]           COLONOSCOPY PROCEDURE REPORT           PATIENT:  [name], [name]  MR#:  [id number]     BIRTHDATE:  [birth year], 48 yrs. old  GENDER:  male     ENDOSCOPIST:  Hedwig Morton. Juanda Chance, MD     REF. BY:  Evelena Peat, M.D.     PROCEDURE DATE:  06/25/2010     PROCEDURE:  Colonoscopy 19147     ASA CLASS:  Class I     INDICATIONS:  Elevated Risk Screening mother with colon cancer in     50'ies     protruding skin tags,become irritated and bleed     MEDICATIONS:   Versed 10 mg, Fentanyl 100 mcg, Benadryl 25 mg           DESCRIPTION OF PROCEDURE:   After the risks benefits and     alternatives of the procedure were thoroughly explained, informed     consent was obtained.  Digital rectal exam was performed and     revealed no rectal masses.   The LB [id number] endoscope     was introduced through the anus and advanced to the cecum, which     was identified by both the appendix and ileocecal valve, without     limitations.  The quality of the prep was good, using MiraLax.     The instrument was then slowly withdrawn as the colon was fully     examined.     <<PROCEDUREIMAGES>>           FINDINGS:  Two polyps were found in the sigmoid colon. at 15 and     30 cm 3-4 mm sessile polyps The polyps were removed using cold     biopsy forceps (see image1, image2, and image6).  mucosal     abnormality in the rectum (see image8, image9, and image10). 2     large polypoid papillomatous protrusion s 1-2 cm long arising     below dentate line, thick stalk, decided not to remove, could     bleed and be too painful to remove  This was otherwise a normal     examination of the colon (see image7, image5, image4, and image3).     Retroflexed views in the  rectum revealed no abnormalities.    The     scope was then withdrawn from the patient and the procedure     completed.           COMPLICATIONS:  None     ENDOSCOPIC IMPRESSION:     1) Two polyps in the sigmoid colon     2) Mucosal abnormality in the rectum     3) Otherwise normal examination     2 long papillomatous protrusions arising below dentate line,     decided not to remove due to potential pain and bleeding     2 sigmoid polyps removed     RECOMMENDATIONS:     1) Await pathology results     set up office surgical visit for removal under local anesthesia,     pt very anxious about  the lesions, will try to expedite     REPEAT EXAM:  In 5 year(s) for.  recall with Dr Arlyce Dice who is out     of town           ______________________________     Hedwig Morton. Juanda Chance, MD           CC:           n.     eSIGNED:   Hedwig Morton. Draven Natter at 06/25/2010 11:03 AM           Page 2 of 3   Delman, [place], [id number]  Note: An exclamation mark (!) indicates a result that was not dispersed into the flowsheet. Document Creation Date: 06/25/2010 11:05 AM _______________________________________________________________________  (1) Order result status: Final Collection or observation date-time: 06/25/2010 10:44 Requested date-time:  Receipt date-time:  Reported date-time:  Referring Physician:   Ordering Physician: Lina Sar [phone]) Specimen Source:  Source: Launa Grill Order Number: [id number] Lab site:   Appended Document: Colonoscopy     Procedures Next Due Date:    Colonoscopy: 07/2015

## 2011-01-09 NOTE — Progress Notes (Signed)
Summary: TRIAGE  Phone Note From Other Clinic   Caller: Aurther Loft @ Dr Caryl Never [phone] Call For: Dr Arlyce Dice Reason for Call: Schedule Patient Appt Summary of Call: Scheduled for a Colon on 07-02-10 but has 2 polyps protruding from his rectum. Wonders if we can work in this wk or next.  Initial call taken by: Leanor Kail [facility],  June 19, 2010 9:49 AM  Follow-up for Phone Call        Pt. will see Willette Cluster NP on 06-20-10 at 8:30am. Aurther Loft will advise pt. of appt/med.list/co-pay/cx.policy. Follow-up by: Laureen Ochs LPN,  June 19, 2010 10:59 AM

## 2011-01-09 NOTE — Letter (Signed)
Summary: Previsit letter  [facility]  [address] [place], [place] [address]   Phone: [phone]  Fax: [phone]       04/23/2010 MRN: [id number]  GAR GLANCE [address] [place], [place]  [address]  Dear [name],  Welcome to the Gastroenterology Division at [facility].    You are scheduled to see a nurse for your pre-procedure visit on 05-21-10 at 1:30p.m. on the 3rd floor at [facility], [address]  We ask that you try to arrive at our office 15 minutes prior to your appointment time to allow for check-in.  Your nurse visit will consist of discussing your medical and surgical history, your immediate family medical history, and your medications.    Please bring a complete list of all your medications or, if you prefer, bring the medication bottles and we will list them.  We will need to be aware of both prescribed and over the counter drugs.  We will need to know exact dosage information as well.  If you are on blood thinners (Coumadin, Plavix, Aggrenox, Ticlid, etc.) please call our office today/prior to your appointment, as we need to consult with your physician about holding your medication.   Please be prepared to read and sign documents such as consent forms, a financial agreement, and acknowledgement forms.  If necessary, and with your consent, a friend or relative is welcome to sit-in on the nurse visit with you.  Please bring your insurance card so that we may make a copy of it.  If your insurance requires a referral to see a specialist, please bring your referral form from your primary care physician.  No co-pay is required for this nurse visit.     If you cannot keep your appointment, please call [phone] to cancel or reschedule prior to your appointment date.  This allows [place] the opportunity to schedule an appointment for another patient in need of care.    Thank you for choosing [facility] for your medical needs.  We  appreciate the opportunity to care for you.  Please visit [place] at our website  to learn more about our practice.                     Sincerely.                                                                                                                   The Gastroenterology Division

## 2011-01-09 NOTE — Assessment & Plan Note (Signed)
Summary: ? RECTAL POLYPS PROTRUDING FROM RECTUM PER DR.TODD   (DR.KAPL...   History of Present Illness Visit Type: Initial Consult Primary GI MD: Melvia Heaps MD [facility] [place] Primary Provider: Evelena Peat, MD Requesting Provider: Kelle Darting, MD Chief Complaint: rectal lesions  History of Present Illness:   Patient is a 50 year old male known to Dr. Arlyce Dice for history of CRC in mother in her 8's. His next screening colonoscopy is in a couple of weeks. Dr. Tawanna Cooler requested patient be seen sooner for protruding rectal polyps. Patient is here with his wife. She gives most of the history as patient is nervous about his visit. Patient gives several year history of an asymptomatic protruding lesion (?polyp) which has become painful over last several days. Hurts to sit down. Thought pain was from a hemorrhoid and treated it as such. Since he had no improvement in pain patient went to see PCP. His BMs are normal and there hasn't been any rectal bleeding.   GI Review of Systems      Denies abdominal pain, acid reflux, belching, bloating, chest pain, dysphagia with liquids, dysphagia with solids, heartburn, loss of appetite, nausea, vomiting, vomiting blood, weight loss, and  weight gain.      Reports rectal pain.     Denies anal fissure, black tarry stools, change in bowel habit, constipation, diarrhea, diverticulosis, fecal incontinence, heme positive stool, hemorrhoids, irritable bowel syndrome, jaundice, light color stool, liver problems, and  rectal bleeding. Preventive Screening-Counseling & Management      Drug Use:  no.      Current Medications (verified): 1)  Aleve 220 Mg Caps (Naproxen Sodium) .... Prn 2)  Clonazepam 0.5 Mg Tbdp (Clonazepam) .... One By Mouth Two Times A Day As Needed  Allergies (verified): No Known Drug Allergies  Past History:  Past Medical History: Allergic rhinitis basal cell cancer of chest wall Hyperlipidemia Anxiety Disorder  Past Surgical  History: Reviewed history from 05/10/2009 and no changes required. Appendectomy--2003  Family History: Reviewed history from 04/04/2010 and no changes required. Family History of Colon CA 1st degree relative <60 Mother Family History Diabetes 1st degree relative  Father Prostate Ca grandfather Purtscher disease--mother  Social History: Reviewed history from 03/16/2009 and no changes required. Occupation: Administrator,  Personal assistant Married Never Smoked Alcohol use-yes Illicit Drug Use - no Drug Use:  no  Review of Systems  The patient denies allergy/sinus, anemia, anxiety-new, arthritis/joint pain, back pain, blood in urine, breast changes/lumps, change in vision, confusion, cough, coughing up blood, depression-new, fainting, fatigue, fever, headaches-new, hearing problems, heart murmur, heart rhythm changes, itching, muscle pains/cramps, night sweats, nosebleeds, shortness of breath, skin rash, sleeping problems, sore throat, swelling of feet/legs, swollen lymph glands, thirst - excessive, urination - excessive, urination changes/pain, urine leakage, vision changes, and voice change.    Vital Signs:  Patient profile:   50 year old male Height:      70 inches Weight:      190.50 pounds BMI:     27.43 Pulse rate:   100 / minute Pulse rhythm:   regular BP sitting:   150 / 98  (left arm)  Vitals Entered By: Milford Cage NCMA (June 20, 2010 8:31 AM)  Physical Exam  General:  Well developed, well nourished, no acute distress. Eyes:  Conjunctiva pink, no icterus.  Lungs:  Clear throughout to auscultation. Heart:  Regular rate and rhythm; no murmurs, rubs,  or bruits. Abdomen:  Abdomen soft, nontender, nondistended. No obvious masses or hepatomegaly.Normal bowel sounds.  Rectal:  Two elongated, mucosal lesions seen protruding from anus. Base of lesions are tender. Inadequate digital exam secondary to patient's discomfort.   Msk:  Symmetrical with no gross deformities. Normal  posture. Neurologic:  Alert and  oriented x4;  grossly normal neurologically. Skin:  Intact without significant lesions or rashes. Cervical Nodes:  No significant cervical adenopathy. Psych:  Alert and cooperative. Normal mood and affect.   Impression & Recommendations:  Problem # 1:  NONSPECIFIC ABN FINDING RAD & OTH EXAM GI TRACT (ICD-793.4) Assessment Comment Only Protruding rectal polyps vrs prolapsed hypertrophied anal papillae. Lesions are painful. Trial of Xylocaine jelly to area. Patient due for screening colonoscopy soon. These lesions will not interfere with the procedure and we may be able to remove or at least biopsy lesions (if it isn't clear what they are). Unable to tell if lesions originate above or below the dentate line. He may need surgical evaluation after colonoscopy.   Patient Instructions: 1)  We have phoned in to pharmacy the Lidocaine Jelly. 2)  Keep your appt for the Colonoscopy on 07-02-10 wotj Dr. Arlyce Dice. 3)  The medication list was reviewed and reconciled.  All changed / newly prescribed medications were explained.  A complete medication list was provided to the patient / caregiver. Prescriptions: XYLOCAINE JELLY 2 % GEL (LIDOCAINE HCL) Use on rectal area up to 3 times daily  #30 cc x 0   Entered by:   Lowry Ram NCMA   Authorized by:   Willette Cluster NP   Signed by:   Lowry Ram NCMA on 06/20/2010   Method used:   Telephoned to ...       [facility]       [address]       [place], [place]  [address]       Ph: [phone] or [phone]       Fax: [phone]   RxID:   [id number]

## 2011-03-03 ENCOUNTER — Encounter: Payer: Self-pay | Admitting: Family Medicine

## 2011-03-03 ENCOUNTER — Ambulatory Visit (INDEPENDENT_AMBULATORY_CARE_PROVIDER_SITE_OTHER): Payer: PRIVATE HEALTH INSURANCE | Admitting: Family Medicine

## 2011-03-03 VITALS — BP 140/88 | Temp 98.7°F | Ht 71.0 in | Wt 142.0 lb

## 2011-03-03 DIAGNOSIS — K649 Unspecified hemorrhoids: Secondary | ICD-10-CM

## 2011-03-03 MED ORDER — AMBULATORY NON FORMULARY MEDICATION: Status: DC

## 2011-03-03 NOTE — Progress Notes (Signed)
  Subjective:    Patient ID: [name], male    DOB: 03/16/1961, 50 y.o.   MRN: [id number]  HPI  Patient seen with recurrent discomfort anal region. Onset couple of weeks ago.  History of anal fissure. No recent bleeding but pain after hard stools. Recent colonoscopy unremarkable. No appetite or weight changes.  Pain is moderate at times.   Review of Systems  Constitutional: Negative for activity change, appetite change and unexpected weight change.  Cardiovascular: Negative for chest pain.  Gastrointestinal: Positive for constipation and rectal pain. Negative for nausea, vomiting, abdominal pain, diarrhea, blood in stool and anal bleeding.       Objective:   Physical Exam  Constitutional: He appears well-developed and well-nourished. No distress.  Cardiovascular: Normal rate, regular rhythm and normal heart sounds.   No murmur heard. Pulmonary/Chest: Effort normal and breath sounds normal. No respiratory distress.  Abdominal: Soft. Bowel sounds are normal. He exhibits no distension. There is no tenderness.  Genitourinary:        Small anal fissure around 12:00 position. No active bleeding. No external hemorrhoids visible          Assessment & Plan:   Anal fissure. Nitroglycerin ointment dilute 0.2% with Vaseline use when necessary for pain. Sitz baths. Avoid constipation.

## 2011-03-03 NOTE — Patient Instructions (Signed)
Anal Fissure     An anal fissure often begins with sharp pain. This is usually following a bowel movement. It often causes bright red blood stained stools. It is the most common cause of rectal bleeding. One common cause of this is passage of a large, hard stool. It can also be caused by having frequent diarrheal stools. Anal fissures that occur for a longtime (chronic) may require surgery.     CAUSES   Passing large, hard stools.   Frequent diarrheal stools.   Constipation.     SYMPTOMS   Bright red, blood stained stools.   Rectal bleeding.     HOME CARE INSTRUCTIONS   If constipation is the cause of the rectal fissure, it may be necessary to add a bulk-forming laxative. A diet high in fruits, whole grains, and vegetables will also help.   Taking hot sitz baths for 1 half hour 4 times per day may help.   Increase your fluid intake.   Only take over-the-counter or prescription medicines for pain, discomfort, or fever as directed by your caregiver. Do not take aspirin as this may increase the tendency for bleeding.   Do not use ointments containing anesthetic medications or hydrocortisone. They could slow healing.   Avoid constipating foods such as bananas and cheese.   In children, brown Karo syrup may be used by adding 1 teaspoon of syrup to 8 ounces of formula. An alternative is to give 3 teaspoons of mineral oil every day.     SEEK MEDICAL CARE IF:  Rectal bleeding continues, changes in intensity, or becomes more severe.     MAKE SURE YOU:     Understand these instructions.     Will watch your condition.   Will get help right away if you are not doing well or get worse.     Document Released: 11/24/2005  Document Re-Released: 02/18/2010  ExitCare Patient Information 2011 [employer].

## 2011-03-31 ENCOUNTER — Other Ambulatory Visit (INDEPENDENT_AMBULATORY_CARE_PROVIDER_SITE_OTHER): Payer: PRIVATE HEALTH INSURANCE | Admitting: Family Medicine

## 2011-03-31 DIAGNOSIS — E785 Hyperlipidemia, unspecified: Secondary | ICD-10-CM

## 2011-03-31 DIAGNOSIS — Z0000 Encounter for general adult medical examination without abnormal findings: Secondary | ICD-10-CM

## 2011-03-31 LAB — LIPID PANEL: Cholesterol: 283 mg/dL — ABNORMAL HIGH (ref 0–200)

## 2011-03-31 LAB — HEPATIC FUNCTION PANEL
ALT: 61 U/L — ABNORMAL HIGH (ref 0–53)
AST: 37 U/L (ref 0–37)
Albumin: 4.3 g/dL (ref 3.5–5.2)

## 2011-03-31 LAB — CBC WITH DIFFERENTIAL/PLATELET
Lymphocytes Relative: 41.1 % (ref 12.0–46.0)
Lymphs Abs: 2.5 10*3/uL (ref 0.7–4.0)
MCHC: 34.7 g/dL (ref 30.0–36.0)
MCV: 94.8 fl (ref 78.0–100.0)
Neutrophils Relative %: 46 % (ref 43.0–77.0)
Platelets: 273 10*3/uL (ref 150.0–400.0)
RBC: 4.8 Mil/uL (ref 4.22–5.81)
RDW: 12.6 % (ref 11.5–14.6)

## 2011-03-31 LAB — POCT URINALYSIS DIPSTICK
Bilirubin, UA: NEGATIVE
Blood, UA: NEGATIVE
Glucose, UA: NEGATIVE
Nitrite, UA: NEGATIVE
Spec Grav, UA: 1.025

## 2011-03-31 LAB — BASIC METABOLIC PANEL
BUN: 8 mg/dL (ref 6–23)
CO2: 28 mEq/L (ref 19–32)
Calcium: 9.3 mg/dL (ref 8.4–10.5)
Chloride: 101 mEq/L (ref 96–112)
Creatinine, Ser: 0.9 mg/dL (ref 0.4–1.5)

## 2011-03-31 LAB — LDL CHOLESTEROL, DIRECT: Direct LDL: 205.8 mg/dL

## 2011-04-07 ENCOUNTER — Ambulatory Visit (INDEPENDENT_AMBULATORY_CARE_PROVIDER_SITE_OTHER): Payer: PRIVATE HEALTH INSURANCE | Admitting: Family Medicine

## 2011-04-07 ENCOUNTER — Encounter: Payer: Self-pay | Admitting: Family Medicine

## 2011-04-07 VITALS — BP 120/84 | HR 80 | Temp 98.2°F | Resp 12 | Ht 69.5 in | Wt 189.0 lb

## 2011-04-07 DIAGNOSIS — E785 Hyperlipidemia, unspecified: Secondary | ICD-10-CM

## 2011-04-07 NOTE — Progress Notes (Signed)
  Subjective:    Patient ID: [name], male    DOB: 10/19/1961, 50 y.o.   MRN: [id number]  HPI Here for complete physical examination. He has history of prior basal cell carcinoma, hyperlipidemia, chronic anxiety. Overall doing well. Had couple of colonoscopies. Tetanus 2009. 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  Nonsmoker.  Only social alcohol use.   Review of Systems  Constitutional: Negative for fever, activity change, appetite change and fatigue.  HENT: Negative for ear pain, congestion and trouble swallowing.   Eyes: Negative for pain and visual disturbance.  Respiratory: Negative for cough, shortness of breath and wheezing.   Cardiovascular: Negative for chest pain and palpitations.  Gastrointestinal: Negative for nausea, vomiting, abdominal pain, diarrhea, constipation, blood in stool, abdominal distention and rectal pain.  Genitourinary: Negative for dysuria, hematuria and testicular pain.  Musculoskeletal: Negative for joint swelling and arthralgias.  Skin: Negative for rash.  Neurological: Negative for dizziness, syncope and headaches.  Hematological: Negative for adenopathy.  Psychiatric/Behavioral: Negative for confusion and dysphoric mood.       Objective:   Physical Exam  Constitutional: He is oriented to person, place, and time. He appears well-developed and well-nourished. No distress.  HENT:  Head: Normocephalic and atraumatic.  Right Ear: External ear normal.  Left Ear: External ear normal.  Mouth/Throat: Oropharynx is clear and moist.  Eyes: Conjunctivae and EOM are normal. Pupils are equal, round, and reactive to light.  Neck: Normal range of motion. Neck supple. No thyromegaly present.  Cardiovascular: Normal rate, regular rhythm and normal heart sounds.   No murmur heard. Pulmonary/Chest: No respiratory distress. He has no wheezes. He has no rales.  Abdominal: Soft. Bowel sounds are normal. He exhibits no distension  and no mass. There is no tenderness. There is no rebound and no guarding.  Genitourinary: Rectum normal and prostate normal.  Musculoskeletal: He exhibits no edema.  Lymphadenopathy:    He has no cervical adenopathy.  Neurological: He is alert and oriented to person, place, and time. He displays normal reflexes. No cranial nerve deficit.  Skin: No rash noted.  Psychiatric: He has a normal mood and affect.          Assessment & Plan:  Labs reviewed with patient. Mildly elevated ALT. Cholesterol is elevated above previous levels. Work on reduction of saturated fats and increased soluble fiber and recheck in 2 months.

## 2011-04-25 NOTE — H&P (Signed)
[place]  Patient:    [name], [name] Visit Number: [id number] MRN: [id number]          Service Type: MED Location: [phone] Attending Physician:  Cherylynn Ridges Dictated by:   Jimmye Norman, M.D. Admit Date:  06/23/2002 Discharge Date: 06/25/2002                           History and Physical  IDENTIFICATION AND CHIEF COMPLAINT:  The patient is a 50 year old gentleman with likely acute appendicitis who comes in for an appendectomy.  HISTORY OF PRESENT ILLNESS:  I was actually contacted by the patients primary care physician, who had sent the patient to an outside radiology group for a CT scan of the abdomen for abdominal pain; the CT demonstrated acute appendicitis.  I was called after the CT diagnosis.  His history is that he started having abdominal pain two days ago at approximately 10 oclock in the evening after he had been to a picnic and thought he had gotten some bad food.  The following day, he continued to have abdominal pain and it started to localize to the right lower quadrant associated with nausea but not vomiting, but decreased appetite.  He had chills but no documented fevers.  No one else in the family was ill with any type of problem.  PAST SURGICAL HISTORY:  He has had no previous surgery.  PAST MEDICAL HISTORY:  No significant past medical history.  MEDICATIONS:  He takes no medicine, although he did take Advil for this particular problem yesterday.  ALLERGIES:  He has no known drug allergies.  REVIEW OF SYSTEMS:  He has had diarrhea, no constipation, chills, no fevers, nausea but not vomiting.  His appetite is decreased.  He is otherwise a healthy gentleman.  PHYSICAL EXAMINATION:  GENERAL:  He is well-nourished, in no acute distress.  VITAL SIGNS:  By report, he was afebrile, however, I cannot find his vital signs; his blood pressure was normal.  HEENT:  He is normocephalic and atraumatic,  anicteric.  NECK:  Supple.  CHEST:  Clear.  ABDOMEN:  Soft but he has tenderness in the right lower quadrant with positive Rovsing sign.  RECTAL:  A rectal exam shows no palpable mass but he is tender in the right lower quadrant.  LABORATORY AND ACCESSORY DATA:  Laboratory studies are all pending.  UA is pending.  IMPRESSION:  Based on clinical exam and his history, the patient has acute appendicitis; also, the fact that he has a CT bringing in the diagnosis of appendicitis makes it relatively straightforward.  PLAN:  The plan is to perform a laparoscopic appendectomy.  I have explained the risks and benefits of this procedure to the patient and we will go ahead with this procedure. Dictated by:   Jimmye Norman, M.D. Attending Physician:  Cherylynn Ridges DD:  06/23/02 TD:  06/28/02 Job: [id number]

## 2011-04-25 NOTE — Op Note (Signed)
[place]  Patient:    [name], [name] Visit Number: [id number] MRN: [id number]          Service Type: MED Location: [phone] Attending Physician:  Cherylynn Ridges Dictated by:   Jimmye Norman, M.D. Proc. Date: 06/23/02 Admit Date:  06/23/2002 Discharge Date: 06/25/2002                             Operative Report  PREOPERATIVE DIAGNOSIS:  Acute appendicitis.  POSTOPERATIVE DIAGNOSIS:  Early acute appendicitis.  PROCEDURE:  Laparoscopic appendectomy.  SURGEON:  Jimmye Norman, M.D.  ASSISTANT:  None.  ANESTHESIA:  General endotracheal.  ESTIMATED BLOOD LOSS:  Less than 20 cc.  COMPLICATIONS:  None.  CONDITION:  Stable.  INDICATIONS FOR PROCEDURE:  The patient is 89 and has abdominal pain localizing to the right lower quadrant and a CT with a diagnosis of acute appendicitis.  FINDINGS:  The patient had early acute appendicitis with most of the disease near the base of the cecum and where it attached to the appendix.  There was no evidence of perforation.  There was no Meckels and no other acute disease noted in the abdomen.  DESCRIPTION OF PROCEDURE:  The patient was taken to the operating room and placed on the table in the supine position.  After an adequate endotracheal anesthetic was administered, he was prepped and draped in the usual sterile manner, exposing the midline and the right upper quadrant and the right lower quadrant.  A supraumbilical curvilinear incision was made using a #11 blade and taken down to the midline supraumbilical fascia.  A Veress needle was passed into the peritoneal cavity and confirmed to be in position with the saline test.  Once this was done, carbon dioxide insufflation was instilled into the peritoneal cavity up to a maximum intra-abdominal pressure of 13 mmHg.  Once that was done, a 10 mm cannula and trocar was passed into the peritoneal cavity and confirmed to be in position with the  laparoscope and attached camera light source.  The patient was placed in Trendelenburg and the left side was tilted down.  A right costal margin 5 mm cannula and suprapubic 11 mm and 12 mm cannula were passed under direct vision.  We then were able to mobilize the cecum and the appendix, and note the acutely inflamed injected appendix.  Most of the disease was at the base of the appendix.  However, developed some injection at the tip.  We isolated the mesoappendix away from the base of the appendix, and then came across the base of the appendix with an Endo-GIA with 3.5 mm staples.  We then came across the mesoappendix with 2.5 mm staples off of the Endo-GIA, completely detaching the appendix which was brought out through the supraumbilical fascia with minimal problems.  We irrigated with saline and noted there to be no further bleeding at the appendiceal base from the mesoappendix.  We aspirated most of the fluid out of the peritoneal cavity and then the wound closed.  We removed all cannulas and reapproximated the supraumbilical fascia using a figure-of-eight stitch of 0 Vicryl.  The skin at all sites were closed with a running 5-0 subcuticular Vicryl.  Quarter percent Marcaine with epinephrine was injected at all sites.  All needle counts, sponge counts, and instrument counts were correct. Dictated by:   Jimmye Norman, M.D. Attending Physician:  Cherylynn Ridges DD:  06/23/02 TD:  06/29/02 Job: [id number]

## 2011-06-02 ENCOUNTER — Encounter: Payer: Self-pay | Admitting: Internal Medicine

## 2011-06-02 ENCOUNTER — Ambulatory Visit (INDEPENDENT_AMBULATORY_CARE_PROVIDER_SITE_OTHER): Payer: PRIVATE HEALTH INSURANCE | Admitting: Internal Medicine

## 2011-06-02 VITALS — BP 122/80 | Temp 98.0°F | Wt 190.0 lb

## 2011-06-02 DIAGNOSIS — T148 Other injury of unspecified body region: Secondary | ICD-10-CM

## 2011-06-02 DIAGNOSIS — R509 Fever, unspecified: Secondary | ICD-10-CM

## 2011-06-02 DIAGNOSIS — T148XXA Other injury of unspecified body region, initial encounter: Secondary | ICD-10-CM

## 2011-06-02 DIAGNOSIS — W57XXXA Bitten or stung by nonvenomous insect and other nonvenomous arthropods, initial encounter: Secondary | ICD-10-CM

## 2011-06-02 MED ORDER — DOXYCYCLINE HYCLATE 100 MG PO TABS: 100.0000 mg | ORAL_TABLET | Freq: Two times a day (BID) | ORAL | Status: AC

## 2011-06-03 LAB — B. BURGDORFI ANTIBODIES: B burgdorferi Ab IgG+IgM: 0.36 {ISR}

## 2011-06-05 ENCOUNTER — Telehealth: Payer: Self-pay

## 2011-06-05 NOTE — Telephone Encounter (Signed)
Message copied by Beverely Low on Thu Jun 05, 2011  1:06 PM ------      Message from: Staci Righter      Created: Wed Jun 04, 2011  5:51 PM       Tick infxn tests negative

## 2011-06-05 NOTE — Telephone Encounter (Signed)
Left message to notify pt test negative. Pt's wife also aware

## 2011-06-08 NOTE — Assessment & Plan Note (Signed)
Suspect possible viral etiology however pursue tickborne illness workup as below. Given prescription of doxycycline to hold. begin if labs abnormal or if symptoms do not improve after total duration of 8-10 days.Followup if no improvement or worsening.

## 2011-06-08 NOTE — Progress Notes (Signed)
  Subjective:    Patient ID: [name], male    DOB: 1961/04/01, 50 y.o.   MRN: [id number]  HPI Patient presents to clinic for evaluation of headache and fever. One week ago suffered a tick bite and tick was removed without complication. Yesterday developed frontal headache fever malaise and increasing nasal congestion. Has no associated rash, neck stiffness or photophobia. Is taking over-the-counter medication for the congestion drainage and fever. No alleviating or exacerbating factors. No sick exposure. No other complaints.  Reviewed past medical history, medications and allergies    Review of Systems see history of present illness     Objective:   Physical Exam    Physical Exam  Vitals reviewed. Constitutional:  appears well-developed and well-nourished. No distress.  HENT:  Head: Normocephalic and atraumatic.  Right Ear: Tympanic membrane, external ear and ear canal normal.  Left Ear: Tympanic membrane, external ear and ear canal normal.  Nose: Nose normal.  Mouth/Throat: Oropharynx is clear and moist. No oropharyngeal exudate.  Eyes: Conjunctivae and EOM are normal. Pupils are equal, round, and reactive to light. Right eye exhibits no discharge. Left eye exhibits no discharge. No scleral icterus.  Neck: Neck supple. No stiffness with full range of motion Cardiovascular: Normal rate, regular rhythm and normal heart sounds.  Exam reveals no gallop and no friction rub.   No murmur heard. Pulmonary/Chest: Effort normal and breath sounds normal. No respiratory distress.  has no wheezes.  has no rales.  Lymphadenopathy:   no cervical adenopathy.  Neurological:  is alert.  Skin: Skin is warm and dry.  not diaphoretic. No rashes Psychiatric: normal mood and affect.      Assessment & Plan:

## 2011-06-08 NOTE — Assessment & Plan Note (Signed)
Obtain RMSF and Lyme titers

## 2011-08-18 IMAGING — CR DG LUMBAR SPINE COMPLETE 4+V
5 series · 5 of 5 positions shown · non-contrast
Comparison: None.

CLINICAL DATA: Low back pain

LUMBAR SPINE - COMPLETE 4+ VIEW

[view not recorded (1 of 5)]
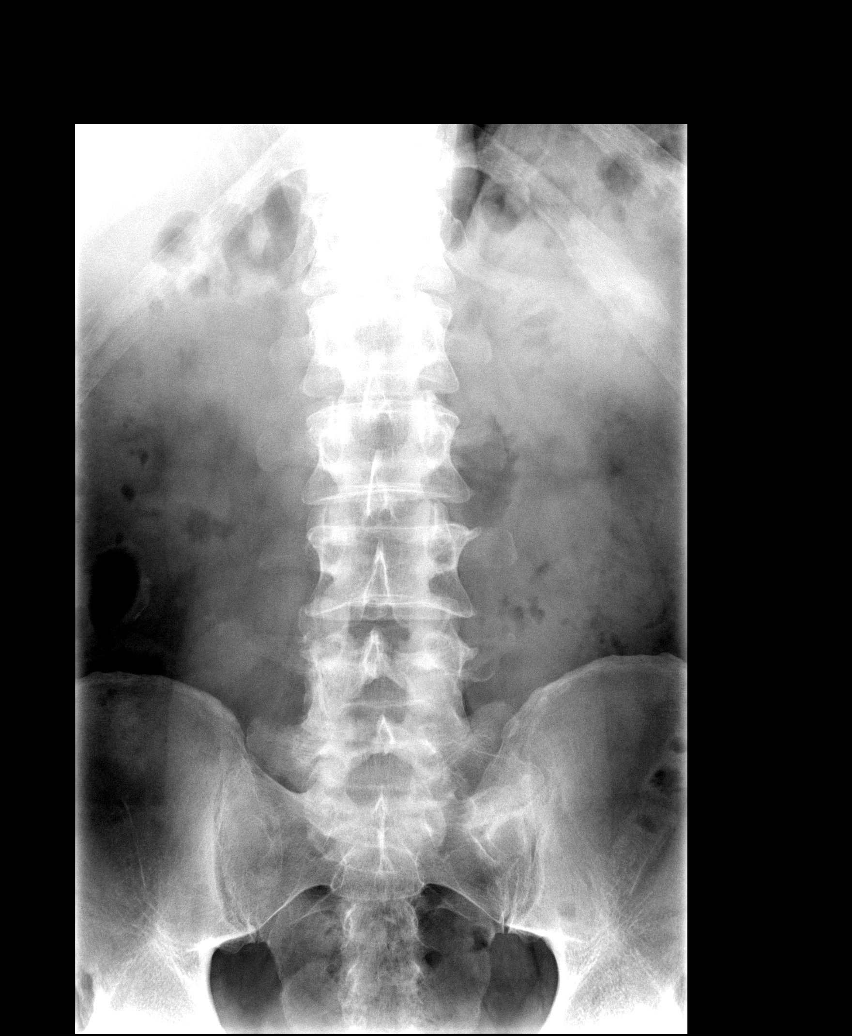

[view not recorded (2 of 5)]
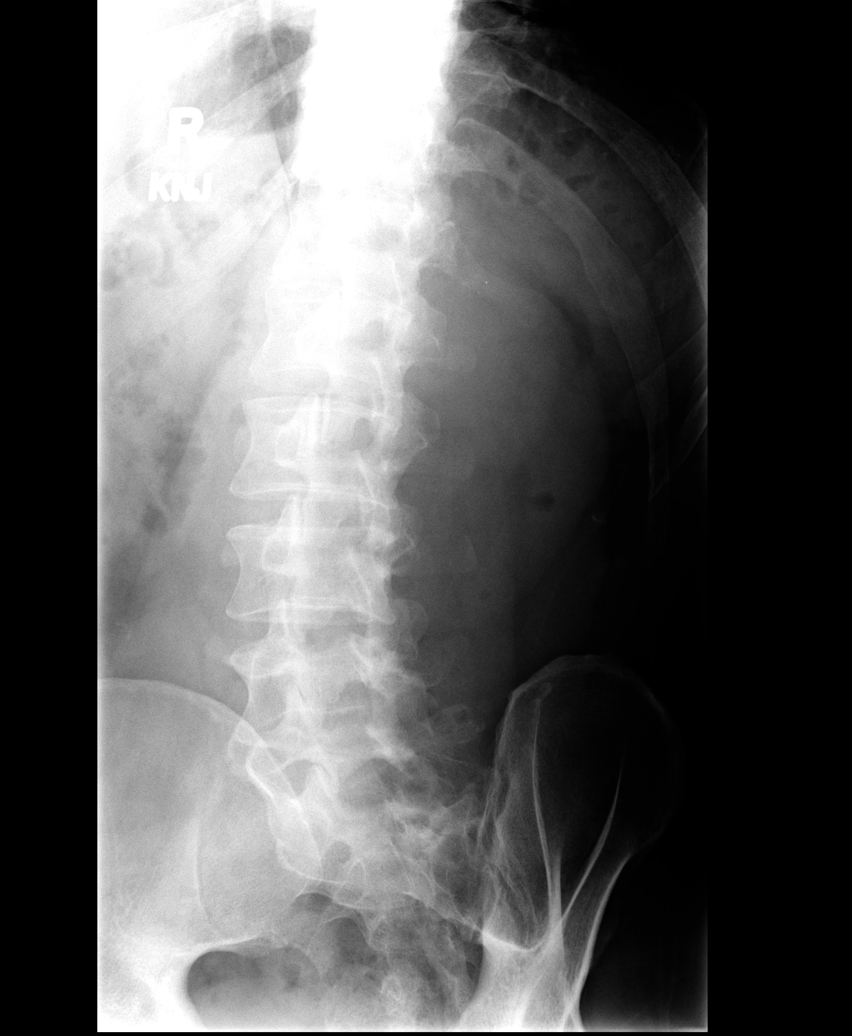

[view not recorded (3 of 5)]
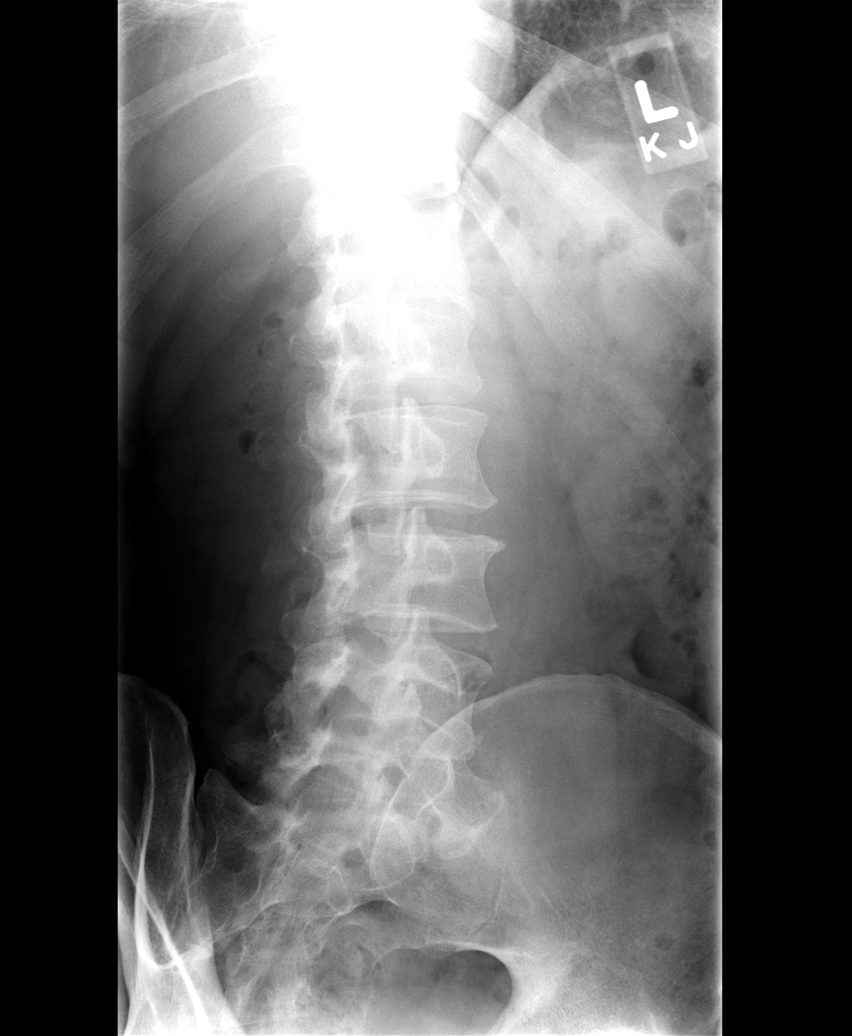

[view not recorded (4 of 5)]
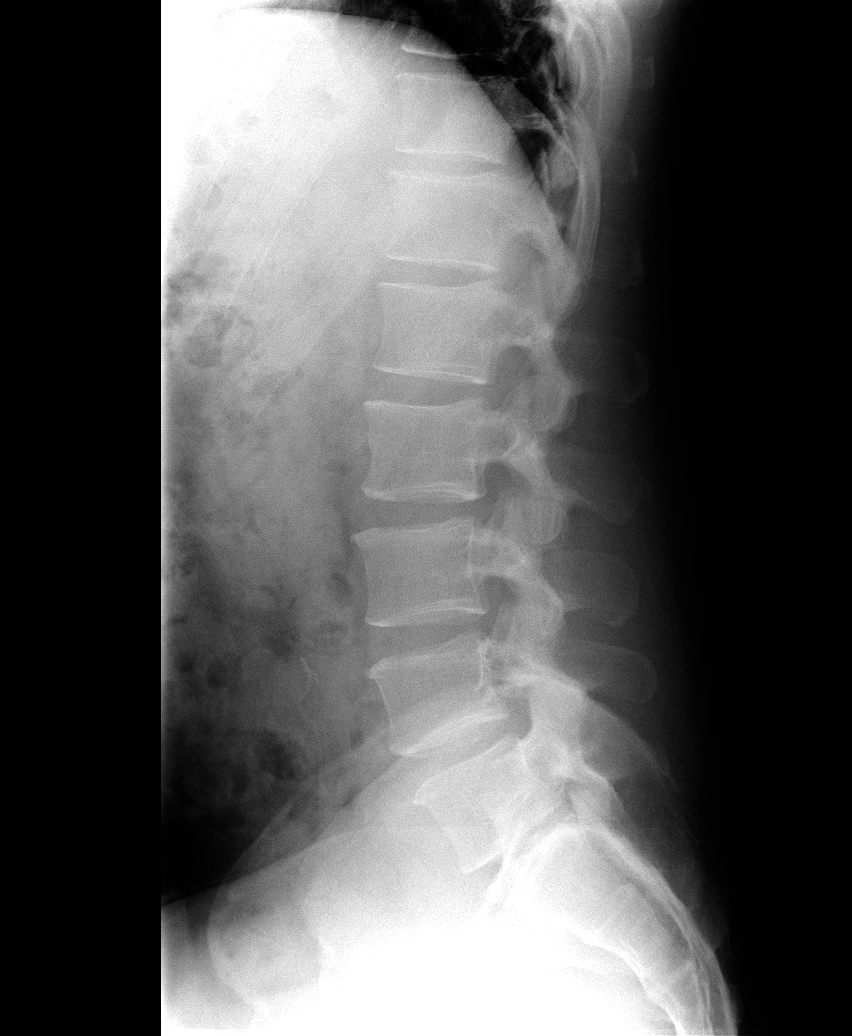

[view not recorded (5 of 5)]
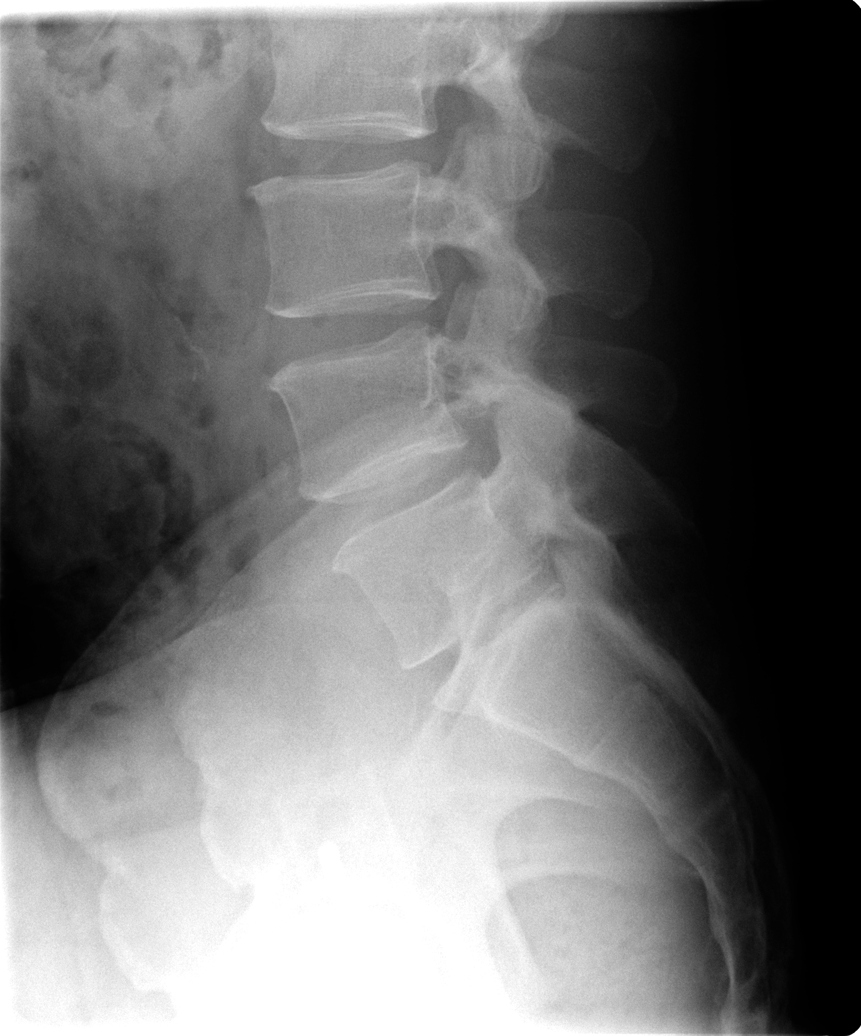

[5 of 5 positions shown; findings below may reference images not displayed]

FINDINGS: Early degenerative disc disease changes noted in the
lower lumbar spine at L5-S1.  Normal alignment.  No fracture.  SI
joints are symmetric and unremarkable. Partial sacralization on the
left side of L5.
IMPRESSION: Degenerative disc disease L5-S1.  Partial sacralization on the left
at L5.

## 2011-09-15 ENCOUNTER — Encounter: Payer: Self-pay | Admitting: Family Medicine

## 2011-09-15 ENCOUNTER — Ambulatory Visit (INDEPENDENT_AMBULATORY_CARE_PROVIDER_SITE_OTHER): Payer: PRIVATE HEALTH INSURANCE | Admitting: Family Medicine

## 2011-09-15 VITALS — BP 140/92 | Temp 98.8°F | Wt 192.0 lb

## 2011-09-15 DIAGNOSIS — B349 Viral infection, unspecified: Secondary | ICD-10-CM

## 2011-09-15 DIAGNOSIS — B9789 Other viral agents as the cause of diseases classified elsewhere: Secondary | ICD-10-CM

## 2011-09-15 NOTE — Progress Notes (Signed)
  Subjective:    Patient ID: [name], male    DOB: September 19, 1961, 50 y.o.   MRN: [id number]  HPI Patient seen with acute illness.  Onset this past weekend a couple days ago. Bodyaches and malaise. Chills and fever up to 101 intermittently. Fever down today. Intermittent sore throat and postnasal drainage. Dry cough. Some relief in symptoms with Advil. Denies any nausea, vomiting, or diarrhea.   Review of Systems  Constitutional: Positive for fever, chills and fatigue. Negative for unexpected weight change.  HENT: Positive for congestion and sore throat. Negative for ear pain and voice change.   Respiratory: Positive for cough.        Objective:   Physical Exam  Constitutional: He appears well-developed and well-nourished. No distress.  HENT:  Right Ear: External ear normal.  Left Ear: External ear normal.  Mouth/Throat: Oropharynx is clear and moist.  Neck: Neck supple.  Cardiovascular: Normal rate, regular rhythm and normal heart sounds.   Pulmonary/Chest: Effort normal and breath sounds normal. No respiratory distress. He has no wheezes. He has no rales.  Lymphadenopathy:    He has no cervical adenopathy.  Skin: No rash noted.          Assessment & Plan:  Viral syndrome. Reassurance. Continue over-the-counter medications and fluids

## 2011-09-15 NOTE — Patient Instructions (Signed)
Viral Syndrome You or your child has Viral Syndrome. It is the most common infection causing "colds" and infections in the nose, throat, sinuses, and breathing tubes. Sometimes the infection causes nausea, vomiting, or diarrhea. The germ that causes the infection is a virus. No antibiotic or other medicine will kill it. There are medicines that you can take to make you or your child more comfortable.  HOME CARE INSTRUCTIONS  Rest in bed until you start to feel better.   If you have diarrhea or vomiting, eat small amounts of crackers and toast. Soup is helpful.   For children, DO NOT give aspirin or medicine that contains aspirin.   Only take over-the-counter or prescription medicines for pain, discomfort, or fever as directed by your caregiver.  SEEK MEDICAL CARE IF:  You or your child has not improved within one week.   You or your child has pain that is not at least partially relieved by over-the-counter medicine.   Thick, colored mucus or blood is coughed up.   Discharge from the nose becomes thick yellow or green.   Diarrhea or vomiting gets worse.   There is any major change in your or your child's condition.   You or your child develops a skin rash, stiff neck, severe headache, or are unable to hold down food or fluid.   You or your child has an oral temperature above 102, not controlled by medicine.   Your baby is older than 3 months with a rectal temperature of 102 F (38.9 C) or higher.   Your baby is 3 months old or younger with a rectal temperature of 100.4 F (38 C) or higher.  Document Released: 11/09/2006 Document Re-Released: 05/14/2010 [employer] Patient Information 2011 [employer]. 

## 2011-09-16 ENCOUNTER — Telehealth: Payer: Self-pay | Admitting: *Deleted

## 2011-09-16 NOTE — Telephone Encounter (Signed)
Pt is still running low grade fever 100-101, and wants Dr. Caryl Never to prescribe an antibiotic so he can get back to work.

## 2011-09-17 MED ORDER — AZITHROMYCIN 250 MG PO TABS: ORAL_TABLET | ORAL | Status: DC

## 2011-09-17 NOTE — Telephone Encounter (Signed)
I feel this is very likely viral but lets go ahead and start Z-pack 5 day pack.

## 2011-09-17 NOTE — Telephone Encounter (Signed)
Left message on pt's personal voice mail to pick up meds.

## 2011-10-02 ENCOUNTER — Telehealth: Payer: Self-pay | Admitting: Family Medicine

## 2011-10-02 MED ORDER — AZITHROMYCIN 250 MG PO TABS: ORAL_TABLET | ORAL | Status: AC

## 2011-10-02 NOTE — Telephone Encounter (Signed)
OK to refill once (Z-pack)

## 2011-10-02 NOTE — Telephone Encounter (Signed)
Pt was in 2 weeks ago and is still experiencing Chest congestion, pressure on chest, cough,sore throat,drainage,and fatigue Finished Zpack about a week ago and still not clearing. Pt requesting you call him and possibly send in another rx

## 2011-10-02 NOTE — Telephone Encounter (Signed)
Pt informed refill done.

## 2011-10-02 NOTE — Telephone Encounter (Signed)
Please advise 

## 2011-10-10 ENCOUNTER — Encounter: Payer: Self-pay | Admitting: Family Medicine

## 2011-10-10 ENCOUNTER — Ambulatory Visit (INDEPENDENT_AMBULATORY_CARE_PROVIDER_SITE_OTHER): Payer: PRIVATE HEALTH INSURANCE | Admitting: Family Medicine

## 2011-10-10 VITALS — BP 150/88 | Temp 98.0°F | Wt 192.0 lb

## 2011-10-10 DIAGNOSIS — R05 Cough: Secondary | ICD-10-CM

## 2011-10-10 DIAGNOSIS — R0602 Shortness of breath: Secondary | ICD-10-CM

## 2011-10-10 DIAGNOSIS — R059 Cough, unspecified: Secondary | ICD-10-CM

## 2011-10-10 MED ORDER — METHYLPREDNISOLONE ACETATE 80 MG/ML IJ SUSP
80.0000 mg | Freq: Once | INTRAMUSCULAR | Status: AC
  Administered 2011-10-10: 80 mg via INTRAMUSCULAR

## 2011-10-10 NOTE — Patient Instructions (Signed)
Consider over-the-counter antihistamine such as brompheniramine or chlorpheniramine at night. These may be sedating. Touch base by middle or end of next week if no better

## 2011-10-10 NOTE — Progress Notes (Signed)
  Subjective:    Patient ID: [name], male    DOB: 1961/05/28, 50 y.o.   MRN: [id number]  HPI  Persistent cough. 2 courses of Zithromax without much improvement. Initially cough productive now mostly dry. No active GERD symptoms. Possibly some mild wheezing at night. Has tried over-the-counter cough medications without much improvement. No history of asthma. Nonsmoker. Denies dyspnea or hemoptysis. Aggravated by activity and cold air   Review of Systems  Constitutional: Negative for fever, chills and fatigue.  HENT: Positive for postnasal drip. Negative for sore throat.   Respiratory: Positive for cough. Negative for shortness of breath.   Cardiovascular: Negative for chest pain.  Neurological: Negative for headaches.       Objective:   Physical Exam  Constitutional: He appears well-developed and well-nourished.  HENT:  Nose: Nose normal.  Mouth/Throat: Oropharynx is clear and moist.  Neck: Neck supple.  Cardiovascular: Normal rate and regular rhythm.   Pulmonary/Chest: Effort normal and breath sounds normal.       Few faint expiratory wheezes. No rales. Symmetric breath sounds  Lymphadenopathy:    He has no cervical adenopathy.          Assessment & Plan:  Persistent cough. Suspect mild reactive airway component. Depo-Medrol 80 mg given. No further antibiotics. Try first generation antihistamine at night

## 2011-10-23 ENCOUNTER — Telehealth: Payer: Self-pay | Admitting: *Deleted

## 2011-10-23 NOTE — Telephone Encounter (Addendum)
Still having post nasal drip and cough.  But, he is  some better.  He will call back if no better to keep Dr. Caryl Never posted.

## 2011-10-24 ENCOUNTER — Telehealth: Payer: Self-pay | Admitting: *Deleted

## 2011-10-24 NOTE — Telephone Encounter (Signed)
Opened in ERROR

## 2011-10-29 NOTE — Telephone Encounter (Signed)
Dr. Burchette notified. 

## 2011-11-10 ENCOUNTER — Ambulatory Visit (INDEPENDENT_AMBULATORY_CARE_PROVIDER_SITE_OTHER): Payer: PRIVATE HEALTH INSURANCE | Admitting: Family Medicine

## 2011-11-10 ENCOUNTER — Encounter: Payer: Self-pay | Admitting: Family Medicine

## 2011-11-10 VITALS — BP 140/92 | Temp 98.1°F | Wt 190.0 lb

## 2011-11-10 DIAGNOSIS — R05 Cough: Secondary | ICD-10-CM

## 2011-11-10 DIAGNOSIS — Z23 Encounter for immunization: Secondary | ICD-10-CM

## 2011-11-10 MED ORDER — FLUTICASONE PROPIONATE 50 MCG/ACT NA SUSP: 2.0000 | Freq: Every day | NASAL | Status: DC

## 2011-11-10 NOTE — Progress Notes (Signed)
  Subjective:    Patient ID: [name], male    DOB: 1961-03-21, 50 y.o.   MRN: [id number]  HPI  Patient seen with some persistent intermittent cough and intermittent right ear pressure. No drainage from the ear. Some nasal congestion ongoing with postnasal drip symptoms. Denies GERD symptoms. Cough nonproductive. No obvious wheezing. Has been on 2 courses of Zithromax without any improvement. No flu vaccine yet. Patient also received Depo-Medrol recently with possibly some initial mild improvement.   Review of Systems  Constitutional: Negative for fever, chills, appetite change and unexpected weight change.  HENT: Negative for ear pain.   Respiratory: Positive for cough. Negative for shortness of breath and wheezing.   Cardiovascular: Negative for chest pain.  Neurological: Negative for headaches.       Objective:   Physical Exam  Constitutional: He appears well-developed and well-nourished. No distress.  HENT:  Right Ear: External ear normal.  Left Ear: External ear normal.  Mouth/Throat: Oropharynx is clear and moist.  Neck: Neck supple. No thyromegaly present.  Cardiovascular: Normal rate and regular rhythm.   Pulmonary/Chest: Breath sounds normal. No respiratory distress. He has no wheezes. He has no rales.  Lymphadenopathy:    He has no cervical adenopathy.          Assessment & Plan:  Persistent cough. Possibly related to postnasal drip. Add Flonase nasal 2 sprays per nostril twice daily. Also try over-the-counter Zyrtec. Call if cough not improving over the next 2 weeks

## 2011-11-10 NOTE — Patient Instructions (Signed)
Take Allegra or Zyrtec regularly for the next couple of weeks and add Flonase. Call if cough not resolving in 2 weeks.

## 2011-12-11 ENCOUNTER — Encounter: Payer: Self-pay | Admitting: Family Medicine

## 2011-12-11 ENCOUNTER — Ambulatory Visit (INDEPENDENT_AMBULATORY_CARE_PROVIDER_SITE_OTHER): Payer: PRIVATE HEALTH INSURANCE | Admitting: Family Medicine

## 2011-12-11 VITALS — BP 140/98 | Temp 97.8°F | Wt 195.0 lb

## 2011-12-11 DIAGNOSIS — F419 Anxiety disorder, unspecified: Secondary | ICD-10-CM

## 2011-12-11 DIAGNOSIS — F411 Generalized anxiety disorder: Secondary | ICD-10-CM

## 2011-12-11 MED ORDER — SERTRALINE HCL 50 MG PO TABS: 50.0000 mg | ORAL_TABLET | Freq: Every day | ORAL | Status: DC

## 2011-12-11 NOTE — Progress Notes (Signed)
  Subjective:    Patient ID: [name], male    DOB: 1961-01-18, 51 y.o.   MRN: [id number]  HPI  Persistent sinus symptoms. Has been seen here multiple times. Overall cough is actually improving. He recently saw ear, nose and throat physician who placed on another course of Augmentin. He's had some ongoing post nasal drip symptoms. Did not take Zyrtec or Flonase consistently. Had recent nasolaryngoscopy reportedly unremarkable. He feels much of this is anxiety related. He tends to fixate on physical symptoms and has been fixating on nasal congestion issues recently.  He has long history of chronic anxiety. He's also been excessively worried about many things including blood pressure. Accompanied by wife today. For years taking Klonopin intermittently and seems to be losing some effect. Pervasive worry about many things. Denies depressive symptoms. No history of any alcohol or illicit drug use.   Review of Systems  Constitutional: Negative for fever, chills, appetite change and unexpected weight change.  Respiratory: Negative for cough and shortness of breath.   Cardiovascular: Negative for chest pain.  Gastrointestinal: Negative for abdominal pain.  Neurological: Negative for dizziness, syncope and headaches.  Psychiatric/Behavioral: Negative for confusion, dysphoric mood and agitation. The patient is nervous/anxious.        Objective:   Physical Exam  Constitutional: He appears well-developed and well-nourished.  HENT:  Right Ear: External ear normal.  Left Ear: External ear normal.  Mouth/Throat: Oropharynx is clear and moist.  Neck: Neck supple.  Cardiovascular: Normal rate and regular rhythm.   Pulmonary/Chest: Effort normal and breath sounds normal. No respiratory distress. He has no wheezes. He has no rales.  Musculoskeletal: He exhibits no edema.  Lymphadenopathy:    He has no cervical adenopathy.          Assessment & Plan:  Pervasive anxiety symptoms. Question  generalized anxiety. Long history of tending to obsess about physical symptoms. Discussed options. Recommend trial of sertraline 50 mg daily and reviewed possible side effects. Reassess one month. Continue supplementation with Klonopin as needed

## 2012-01-08 ENCOUNTER — Ambulatory Visit (INDEPENDENT_AMBULATORY_CARE_PROVIDER_SITE_OTHER): Payer: PRIVATE HEALTH INSURANCE | Admitting: Family Medicine

## 2012-01-08 ENCOUNTER — Encounter: Payer: Self-pay | Admitting: Family Medicine

## 2012-01-08 VITALS — BP 120/82 | Temp 98.0°F | Wt 193.0 lb

## 2012-01-08 DIAGNOSIS — F411 Generalized anxiety disorder: Secondary | ICD-10-CM

## 2012-01-08 MED ORDER — SERTRALINE HCL 50 MG PO TABS: ORAL_TABLET | ORAL | Status: DC

## 2012-01-08 NOTE — Patient Instructions (Addendum)
Titrate Zoloft to one and one half tablet daily Touch base in 2-3 weeks if not seeing additional improvement.

## 2012-01-08 NOTE — Progress Notes (Signed)
  Subjective:    Patient ID: [name], male    DOB: 1961/11/08, 51 y.o.   MRN: [id number]  HPI  Followup anxiety disorder. We started sertraline 50 mg daily. Had mild nausea for about 3 days but not at this point. He feels slightly jittery but overall his anxiety symptoms are greatly improved. He is sleeping through the night better. He is having both decreased frequency and decreased severity of anxiety symptoms. No other side effects.   Review of Systems  Constitutional: Negative for appetite change and unexpected weight change.  Respiratory: Negative for shortness of breath.   Cardiovascular: Negative for chest pain.  Psychiatric/Behavioral: Negative for dysphoric mood and agitation.       Objective:   Physical Exam  Constitutional: He appears well-developed and well-nourished. No distress.  Cardiovascular: Normal rate and regular rhythm.   Pulmonary/Chest: Effort normal and breath sounds normal. No respiratory distress. He has no wheezes. He has no rales.  Psychiatric: He has a normal mood and affect.          Assessment & Plan:  Anxiety disorder improved. Titrate sertraline to 75 mg and patient will take medication at night.  Touch base 2-3 weeks

## 2012-01-15 ENCOUNTER — Other Ambulatory Visit: Payer: Self-pay | Admitting: *Deleted

## 2012-01-15 NOTE — Telephone Encounter (Signed)
Clonazepam 0.5 refill request, 1/2 to 1 tab bid prn, last filled #60 with 1 refill on 10/12/2009

## 2012-01-15 NOTE — Telephone Encounter (Signed)
Refill once 

## 2012-01-16 MED ORDER — CLONAZEPAM 0.5 MG PO TABS: 0.5000 mg | ORAL_TABLET | Freq: Two times a day (BID) | ORAL | Status: AC | PRN

## 2012-01-19 ENCOUNTER — Other Ambulatory Visit: Payer: Self-pay | Admitting: *Deleted

## 2012-01-19 NOTE — Telephone Encounter (Signed)
Opened in error, wrong pt

## 2012-03-15 ENCOUNTER — Telehealth: Payer: Self-pay | Admitting: Family Medicine

## 2012-03-15 DIAGNOSIS — Z0000 Encounter for general adult medical examination without abnormal findings: Secondary | ICD-10-CM

## 2012-03-15 NOTE — Telephone Encounter (Signed)
OK to add but make sure he is aware insurance may not cover.

## 2012-03-15 NOTE — Telephone Encounter (Signed)
Pt is sch for cpx labs on 03-31-2012, pt would like PSA added to labwork. Pt does not have a family history of psa cancer and pt will not be 51 until oct,

## 2012-03-16 NOTE — Telephone Encounter (Signed)
Pt informed and does want PSA done, aware insurance may not pay.

## 2012-03-31 ENCOUNTER — Other Ambulatory Visit (INDEPENDENT_AMBULATORY_CARE_PROVIDER_SITE_OTHER): Payer: PRIVATE HEALTH INSURANCE

## 2012-03-31 DIAGNOSIS — Z0000 Encounter for general adult medical examination without abnormal findings: Secondary | ICD-10-CM

## 2012-03-31 DIAGNOSIS — E785 Hyperlipidemia, unspecified: Secondary | ICD-10-CM

## 2012-03-31 LAB — CBC WITH DIFFERENTIAL/PLATELET
Eosinophils Relative: 3.3 % (ref 0.0–5.0)
HCT: 46.1 % (ref 39.0–52.0)
Hemoglobin: 15.4 g/dL (ref 13.0–17.0)
Lymphs Abs: 2.2 10*3/uL (ref 0.7–4.0)
Monocytes Relative: 8.5 % (ref 3.0–12.0)
Neutro Abs: 3.7 10*3/uL (ref 1.4–7.7)
WBC: 6.8 10*3/uL (ref 4.5–10.5)

## 2012-03-31 LAB — HEPATIC FUNCTION PANEL
Albumin: 4.3 g/dL (ref 3.5–5.2)
Bilirubin, Direct: 0.1 mg/dL (ref 0.0–0.3)
Total Protein: 7.3 g/dL (ref 6.0–8.3)

## 2012-03-31 LAB — POCT URINALYSIS DIPSTICK
Bilirubin, UA: NEGATIVE
Blood, UA: NEGATIVE
Ketones, UA: NEGATIVE
Spec Grav, UA: 1.02
pH, UA: 6

## 2012-03-31 LAB — LIPID PANEL
HDL: 54.6 mg/dL (ref >39.00)
Total CHOL/HDL Ratio: 4
VLDL: 22.6 mg/dL (ref 0.0–40.0)

## 2012-03-31 LAB — BASIC METABOLIC PANEL
Chloride: 105 mEq/L (ref 96–112)
GFR: 105.92 mL/min (ref >60.00)
Glucose, Bld: 91 mg/dL (ref 70–99)
Potassium: 4.8 mEq/L (ref 3.5–5.1)
Sodium: 140 mEq/L (ref 135–145)

## 2012-04-07 ENCOUNTER — Encounter: Payer: Self-pay | Admitting: Family Medicine

## 2012-04-07 ENCOUNTER — Ambulatory Visit (INDEPENDENT_AMBULATORY_CARE_PROVIDER_SITE_OTHER): Payer: PRIVATE HEALTH INSURANCE | Admitting: Family Medicine

## 2012-04-07 VITALS — BP 130/82 | HR 72 | Temp 97.0°F | Resp 12 | Ht 70.0 in | Wt 192.0 lb

## 2012-04-07 DIAGNOSIS — Z0000 Encounter for general adult medical examination without abnormal findings: Secondary | ICD-10-CM

## 2012-04-07 NOTE — Progress Notes (Signed)
  Subjective:    Patient ID: [name], male    DOB: [birth year], 51 y.o.   MRN: [id number]  HPI  Complete physical. Made dietary changes since last year. Reduction of red meat. Stays very active physically. Anxiety symptoms have greatly improved with sertraline. Rarely takes clonazepam. Nonsmoker. Usually one glass of red wine per day. Colonoscopy couple years ago and getting every 5 years because of family history of colon cancer in his mother.  Past Medical History  Diagnosis Date  . CARCINOMA, BASAL CELL 05/10/2009  . HYPERLIPIDEMIA 05/10/2009  . ANXIETY, CHRONIC 10/12/2009  . Anal and rectal polyp 06/18/2010  . Acute prostatitis 05/24/2010  . Actinic keratosis 10/12/2009  . NONSPECIFIC ABN FINDING RAD & OTH EXAM GI TRACT 06/20/2010   Past Surgical History  Procedure Date  . Appendectomy 2003    reports that he has never smoked. He does not have any smokeless tobacco history on file. His alcohol and drug histories not on file. family history includes Cancer in his mother and paternal grandfather and Diabetes in his father. No Known Allergies   Review of Systems  Constitutional: Negative for fever, activity change, appetite change and fatigue.  HENT: Negative for ear pain, congestion and trouble swallowing.   Eyes: Negative for pain and visual disturbance.  Respiratory: Negative for cough, shortness of breath and wheezing.   Cardiovascular: Negative for chest pain and palpitations.  Gastrointestinal: Negative for nausea, vomiting, abdominal pain, diarrhea, constipation, blood in stool, abdominal distention and rectal pain.  Genitourinary: Negative for dysuria, hematuria and testicular pain.  Musculoskeletal: Negative for joint swelling and arthralgias.  Skin: Negative for rash.  Neurological: Negative for dizziness, syncope and headaches.  Hematological: Negative for adenopathy.  Psychiatric/Behavioral: Negative for confusion and dysphoric mood.       Objective:   Physical  Exam  Constitutional: He is oriented to person, place, and time. He appears well-developed and well-nourished. No distress.  HENT:  Head: Normocephalic and atraumatic.  Right Ear: External ear normal.  Left Ear: External ear normal.  Mouth/Throat: Oropharynx is clear and moist.  Eyes: Conjunctivae and EOM are normal. Pupils are equal, round, and reactive to light.  Neck: Normal range of motion. Neck supple. No thyromegaly present.  Cardiovascular: Normal rate, regular rhythm and normal heart sounds.   No murmur heard. Pulmonary/Chest: No respiratory distress. He has no wheezes. He has no rales.  Abdominal: Soft. Bowel sounds are normal. He exhibits no distension and no mass. There is no tenderness. There is no rebound and no guarding.  Musculoskeletal: He exhibits no edema.  Lymphadenopathy:    He has no cervical adenopathy.  Neurological: He is alert and oriented to person, place, and time. He displays normal reflexes. No cranial nerve deficit.  Skin: No rash noted.  Psychiatric: He has a normal mood and affect.          Assessment & Plan:  Complete physical. Healthy 51 year old male. Labs reviewed with patient and improved. Colonoscopy already done recently as above.

## 2012-11-30 ENCOUNTER — Other Ambulatory Visit: Payer: Self-pay | Admitting: Family Medicine

## 2013-06-02 ENCOUNTER — Other Ambulatory Visit (INDEPENDENT_AMBULATORY_CARE_PROVIDER_SITE_OTHER): Payer: PRIVATE HEALTH INSURANCE

## 2013-06-02 DIAGNOSIS — Z0000 Encounter for general adult medical examination without abnormal findings: Secondary | ICD-10-CM

## 2013-06-02 LAB — PSA: PSA: 0.6 ng/mL (ref 0.10–4.00)

## 2013-06-02 LAB — HEPATIC FUNCTION PANEL
ALT: 49 U/L (ref 0–53)
AST: 30 U/L (ref 0–37)
Bilirubin, Direct: 0.2 mg/dL (ref 0.0–0.3)
Total Protein: 8 g/dL (ref 6.0–8.3)

## 2013-06-02 LAB — BASIC METABOLIC PANEL
BUN: 10 mg/dL (ref 6–23)
CO2: 28 mEq/L (ref 19–32)
Calcium: 9.9 mg/dL (ref 8.4–10.5)
Creatinine, Ser: 0.9 mg/dL (ref 0.4–1.5)
GFR: 101.14 mL/min (ref >60.00)
Glucose, Bld: 100 mg/dL — ABNORMAL HIGH (ref 70–99)

## 2013-06-02 LAB — LIPID PANEL
HDL: 53 mg/dL (ref >39.00)
Total CHOL/HDL Ratio: 5
Triglycerides: 149 mg/dL (ref 0.0–149.0)
VLDL: 29.8 mg/dL (ref 0.0–40.0)

## 2013-06-02 LAB — POCT URINALYSIS DIPSTICK
Bilirubin, UA: NEGATIVE
Blood, UA: NEGATIVE
Nitrite, UA: NEGATIVE
Spec Grav, UA: 1.015
pH, UA: 6

## 2013-06-02 LAB — CBC WITH DIFFERENTIAL/PLATELET
Basophils Relative: 0.3 % (ref 0.0–3.0)
Hemoglobin: 15.6 g/dL (ref 13.0–17.0)
Lymphocytes Relative: 15.1 % (ref 12.0–46.0)
Lymphs Abs: 2.4 10*3/uL (ref 0.7–4.0)
MCHC: 34.6 g/dL (ref 30.0–36.0)
Monocytes Absolute: 0.8 10*3/uL (ref 0.1–1.0)
Neutrophils Relative %: 78.9 % — ABNORMAL HIGH (ref 43.0–77.0)
Platelets: 290 10*3/uL (ref 150.0–400.0)
RDW: 12.8 % (ref 11.5–14.6)

## 2013-06-02 LAB — LDL CHOLESTEROL, DIRECT: Direct LDL: 175.6 mg/dL

## 2013-06-02 LAB — TSH: TSH: 1.68 u[IU]/mL (ref 0.35–5.50)

## 2013-06-08 ENCOUNTER — Ambulatory Visit (INDEPENDENT_AMBULATORY_CARE_PROVIDER_SITE_OTHER): Payer: PRIVATE HEALTH INSURANCE | Admitting: Family Medicine

## 2013-06-08 ENCOUNTER — Encounter: Payer: Self-pay | Admitting: Family Medicine

## 2013-06-08 VITALS — BP 132/78 | HR 79 | Temp 97.6°F | Ht 71.0 in | Wt 196.0 lb

## 2013-06-08 DIAGNOSIS — D72829 Elevated white blood cell count, unspecified: Secondary | ICD-10-CM

## 2013-06-08 DIAGNOSIS — Z0000 Encounter for general adult medical examination without abnormal findings: Secondary | ICD-10-CM

## 2013-06-08 LAB — CBC WITH DIFFERENTIAL/PLATELET
Basophils Absolute: 0.1 10*3/uL (ref 0.0–0.1)
Eosinophils Relative: 2.1 % (ref 0.0–5.0)
HCT: 44.4 % (ref 39.0–52.0)
Lymphocytes Relative: 30.2 % (ref 12.0–46.0)
Monocytes Relative: 7.6 % (ref 3.0–12.0)
Neutrophils Relative %: 59.4 % (ref 43.0–77.0)
Platelets: 293 10*3/uL (ref 150.0–400.0)
RDW: 12.7 % (ref 11.5–14.6)
WBC: 8.6 10*3/uL (ref 4.5–10.5)

## 2013-06-08 NOTE — Patient Instructions (Signed)

## 2013-06-08 NOTE — Progress Notes (Signed)
  Subjective:    Patient ID: [name], male    DOB: [birth year], 52 y.o.   MRN: [id number]  HPI Patient for complete physical Generally very healthy. He has history of chronic anxiety which is controlled with sertraline 50 mg daily. Rarely takes Klonopin.  Past history of basal cell carcinoma anterior chest. No recent concerning lesions. No specific complaints.  Positive family history of colon cancer his mother. Colonoscopy 2011 normal. Tetanus 2009. No history of smoking  Past Medical History  Diagnosis Date  . CARCINOMA, BASAL CELL 05/10/2009  . HYPERLIPIDEMIA 05/10/2009  . ANXIETY, CHRONIC 10/12/2009  . Anal and rectal polyp 06/18/2010  . Acute prostatitis 05/24/2010  . Actinic keratosis 10/12/2009  . NONSPECIFIC ABN FINDING RAD & OTH EXAM GI TRACT 06/20/2010   Past Surgical History  Procedure Laterality Date  . Appendectomy  2003    reports that he has never smoked. He does not have any smokeless tobacco history on file. His alcohol and drug histories are not on file. family history includes Cancer in his mother and paternal grandfather; Diabetes in his father; and Parkinsonism in his mother. No Known Allergies    Review of Systems  Constitutional: Negative for fever, activity change, appetite change, fatigue and unexpected weight change.  HENT: Negative for ear pain, congestion and trouble swallowing.   Eyes: Negative for pain and visual disturbance.  Respiratory: Negative for cough, shortness of breath and wheezing.   Cardiovascular: Negative for chest pain and palpitations.  Gastrointestinal: Negative for nausea, vomiting, abdominal pain, diarrhea, constipation, blood in stool, abdominal distention and rectal pain.  Genitourinary: Negative for dysuria, hematuria and testicular pain.  Musculoskeletal: Negative for joint swelling and arthralgias.  Skin: Negative for rash.  Neurological: Negative for dizziness, syncope and headaches.  Hematological: Negative for  adenopathy.  Psychiatric/Behavioral: Negative for confusion and dysphoric mood.       Objective:   Physical Exam  Constitutional: He is oriented to person, place, and time. He appears well-developed and well-nourished. No distress.  HENT:  Head: Normocephalic and atraumatic.  Right Ear: External ear normal.  Left Ear: External ear normal.  Mouth/Throat: Oropharynx is clear and moist.  Eyes: Conjunctivae and EOM are normal. Pupils are equal, round, and reactive to light.  Neck: Normal range of motion. Neck supple. No thyromegaly present.  Cardiovascular: Normal rate, regular rhythm and normal heart sounds.   No murmur heard. Pulmonary/Chest: No respiratory distress. He has no wheezes. He has no rales.  Abdominal: Soft. Bowel sounds are normal. He exhibits no distension and no mass. There is no tenderness. There is no rebound and no guarding.  Musculoskeletal: He exhibits no edema.  Lymphadenopathy:    He has no cervical adenopathy.  Neurological: He is alert and oriented to person, place, and time. He displays normal reflexes. No cranial nerve deficit.  Skin: No rash noted.  No concerning skin lesions  Psychiatric: He has a normal mood and affect.          Assessment & Plan:  Complete physical. Colonoscopy up-to-date. Tetanus up-to-date. Labs reviewed with patient. Mild leukocytosis. Patient asymptomatic. Repeat CBC. Patient had fasting blood sugar 100. Discussed reduction in high glycemic foods

## 2013-06-09 ENCOUNTER — Encounter: Payer: PRIVATE HEALTH INSURANCE | Admitting: Family Medicine

## 2013-10-19 ENCOUNTER — Other Ambulatory Visit: Payer: Self-pay | Admitting: Family Medicine

## 2014-04-26 ENCOUNTER — Ambulatory Visit (INDEPENDENT_AMBULATORY_CARE_PROVIDER_SITE_OTHER): Payer: Managed Care, Other (non HMO) | Admitting: Family Medicine

## 2014-04-26 ENCOUNTER — Encounter: Payer: Self-pay | Admitting: Family Medicine

## 2014-04-26 VITALS — BP 130/80 | HR 90 | Temp 98.1°F | Wt 196.0 lb

## 2014-04-26 DIAGNOSIS — R209 Unspecified disturbances of skin sensation: Secondary | ICD-10-CM

## 2014-04-26 DIAGNOSIS — R202 Paresthesia of skin: Secondary | ICD-10-CM

## 2014-04-26 DIAGNOSIS — M25512 Pain in left shoulder: Secondary | ICD-10-CM

## 2014-04-26 DIAGNOSIS — M25519 Pain in unspecified shoulder: Secondary | ICD-10-CM

## 2014-04-26 NOTE — Progress Notes (Signed)
Pre visit review using our clinic review tool, if applicable. No additional management support is needed unless otherwise documented below in the visit note. 

## 2014-04-26 NOTE — Progress Notes (Signed)
   Subjective:    Patient ID: [name], male    DOB: 01/22/1961, 53 y.o.   MRN: [id number]  Shoulder Pain  Associated symptoms include numbness.   Patient seen with left shoulder pain and occasional numbness left hand first through third digits. He denies any neck pain. Shoulder pain off and on for months. No specific injury. Pain worse with abduction. No pain with internal rotation. No radiculopathy symptoms. No alleviating factors.  Patient has intermittent numbness left hand as above. Denies any wrist pain. No associated hand pain. He's noted that numbness seems to be worse when driving with hand on steering wheel. No wrist pain.  No LUE edema.   Past Medical History  Diagnosis Date  . CARCINOMA, BASAL CELL 05/10/2009  . HYPERLIPIDEMIA 05/10/2009  . ANXIETY, CHRONIC 10/12/2009  . Anal and rectal polyp 06/18/2010  . Acute prostatitis 05/24/2010  . Actinic keratosis 10/12/2009  . NONSPECIFIC ABN FINDING RAD & OTH EXAM GI TRACT 06/20/2010   Past Surgical History  Procedure Laterality Date  . Appendectomy  2003    reports that he has never smoked. He does not have any smokeless tobacco history on file. His alcohol and drug histories are not on file. family history includes Cancer in his mother and paternal grandfather; Diabetes in his father; Parkinsonism in his mother. No Known Allergies    Review of Systems  Cardiovascular: Negative for chest pain.  Skin: Negative for rash.  Neurological: Positive for numbness. Negative for dizziness and weakness.  Hematological: Negative for adenopathy.       Objective:   Physical Exam  Constitutional: He appears well-developed and well-nourished.  Cardiovascular: Normal rate and regular rhythm.   Pulmonary/Chest: Effort normal and breath sounds normal. No respiratory distress. He has no wheezes. He has no rales.  Musculoskeletal:  Left shoulder reveals full range of motion. He has mild pain with abduction grater than 90 against  resistance. No left upper extremity edema. Distal pulses are 2+. No atrophy.  Neurological:  Deep tendon reflexes are 2+ throughout upper extremities. Full-strength.          Assessment & Plan:  Patient is with intermittent left shoulder pain. Suspect mild rotator cuff tendinitis. We offered option of corticosteroid injection and at this point he wishes to wait.  He does not have any significant weakness.  Intermittent paresthesias involving left hand -first through third digits. Question carpal tunnel but he has no associated pain. No classic cervical radiculopathy symptoms.  Nonfocal exam. We recommended observation. Consider neck films and further evaluation if symptoms persist

## 2014-04-26 NOTE — Patient Instructions (Signed)
Paresthesia °Paresthesia is an abnormal burning or prickling sensation. This sensation is generally felt in the hands, arms, legs, or feet. However, it may occur in any part of the body. It is usually not painful. The feeling may be described as: °· Tingling or numbness. °· "Pins and needles." °· Skin crawling. °· Buzzing. °· Limbs "falling asleep." °· Itching. °Most people experience temporary (transient) paresthesia at some time in their lives. °CAUSES  °Paresthesia may occur when you breathe too quickly (hyperventilation). It can also occur without any apparent cause. Commonly, paresthesia occurs when pressure is placed on a nerve. The feeling quickly goes away once the pressure is removed. For some people, however, paresthesia is a long-lasting (chronic) condition caused by an underlying disorder. The underlying disorder may be: °· A traumatic, direct injury to nerves. Examples include a: °· Broken (fractured) neck. °· Fractured skull. °· A disorder affecting the brain and spinal cord (central nervous system). Examples include: °· Transverse myelitis. °· Encephalitis. °· Transient ischemic attack. °· Multiple sclerosis. °· Stroke. °· Tumor or blood vessel problems, such as an arteriovenous malformation pressing against the brain or spinal cord. °· A condition that damages the peripheral nerves (peripheral neuropathy). Peripheral nerves are not part of the brain and spinal cord. These conditions include: °· Diabetes. °· Peripheral vascular disease. °· Nerve entrapment syndromes, such as carpal tunnel syndrome. °· Shingles. °· Hypothyroidism. °· Vitamin B12 deficiencies. °· Alcoholism. °· Heavy metal poisoning (lead, arsenic). °· Rheumatoid arthritis. °· Systemic lupus erythematosus. °DIAGNOSIS  °Your caregiver will attempt to find the underlying cause of your paresthesia. Your caregiver may: °· Take your medical history. °· Perform a physical exam. °· Order various lab tests. °· Order imaging tests. °TREATMENT    °Treatment for paresthesia depends on the underlying cause. °HOME CARE INSTRUCTIONS °· Avoid drinking alcohol. °· You may consider massage or acupuncture to help relieve your symptoms. °· Keep all follow-up appointments as directed by your caregiver. °SEEK IMMEDIATE MEDICAL CARE IF:  °· You feel weak. °· You have trouble walking or moving. °· You have problems with speech or vision. °· You feel confused. °· You cannot control your bladder or bowel movements. °· You feel numbness after an injury. °· You faint. °· Your burning or prickling feeling gets worse when walking. °· You have pain, cramps, or dizziness. °· You develop a rash. °MAKE SURE YOU: °· Understand these instructions. °· Will watch your condition. °· Will get help right away if you are not doing well or get worse. °Document Released: 11/14/2002 Document Revised: 02/16/2012 Document Reviewed: 08/15/2011 °[employer]® Patient Information ©2014 [employer]. ° °

## 2014-06-07 ENCOUNTER — Other Ambulatory Visit: Payer: PRIVATE HEALTH INSURANCE

## 2014-06-19 ENCOUNTER — Encounter: Payer: PRIVATE HEALTH INSURANCE | Admitting: Family Medicine

## 2014-06-19 ENCOUNTER — Encounter: Payer: Self-pay | Admitting: *Deleted

## 2014-06-19 ENCOUNTER — Other Ambulatory Visit (INDEPENDENT_AMBULATORY_CARE_PROVIDER_SITE_OTHER): Payer: PRIVATE HEALTH INSURANCE

## 2014-06-19 DIAGNOSIS — Z0000 Encounter for general adult medical examination without abnormal findings: Secondary | ICD-10-CM

## 2014-06-19 LAB — CBC WITH DIFFERENTIAL/PLATELET
BASOS PCT: 1.3 % (ref 0.0–3.0)
Basophils Absolute: 0.1 10*3/uL (ref 0.0–0.1)
Eosinophils Absolute: 0.1 10*3/uL (ref 0.0–0.7)
Eosinophils Relative: 1.6 % (ref 0.0–5.0)
HCT: 46.8 % (ref 39.0–52.0)
HEMOGLOBIN: 15.5 g/dL (ref 13.0–17.0)
LYMPHS PCT: 29.5 % (ref 12.0–46.0)
Lymphs Abs: 2.1 10*3/uL (ref 0.7–4.0)
MCHC: 33.2 g/dL (ref 30.0–36.0)
MCV: 96 fl (ref 78.0–100.0)
Monocytes Absolute: 0.5 10*3/uL (ref 0.1–1.0)
Monocytes Relative: 7.1 % (ref 3.0–12.0)
NEUTROS ABS: 4.4 10*3/uL (ref 1.4–7.7)
Neutrophils Relative %: 60.5 % (ref 43.0–77.0)
Platelets: 327 10*3/uL (ref 150.0–400.0)
RBC: 4.88 Mil/uL (ref 4.22–5.81)
RDW: 13.3 % (ref 11.5–15.5)
WBC: 7.3 10*3/uL (ref 4.0–10.5)

## 2014-06-19 LAB — LIPID PANEL
CHOL/HDL RATIO: 5
Cholesterol: 293 mg/dL — ABNORMAL HIGH (ref 0–200)
HDL: 60 mg/dL (ref >39.00)
LDL Cholesterol: 196 mg/dL — ABNORMAL HIGH (ref 0–99)
NONHDL: 233
Triglycerides: 184 mg/dL — ABNORMAL HIGH (ref 0.0–149.0)
VLDL: 36.8 mg/dL (ref 0.0–40.0)

## 2014-06-19 LAB — HEPATIC FUNCTION PANEL
ALBUMIN: 4.6 g/dL (ref 3.5–5.2)
ALK PHOS: 91 U/L (ref 39–117)
ALT: 63 U/L — ABNORMAL HIGH (ref 0–53)
AST: 41 U/L — ABNORMAL HIGH (ref 0–37)
Bilirubin, Direct: 0.1 mg/dL (ref 0.0–0.3)
TOTAL PROTEIN: 8 g/dL (ref 6.0–8.3)
Total Bilirubin: 1.2 mg/dL (ref 0.2–1.2)

## 2014-06-19 LAB — TSH: TSH: 1.87 u[IU]/mL (ref 0.35–4.50)

## 2014-06-19 LAB — POCT URINALYSIS DIPSTICK
Bilirubin, UA: NEGATIVE
Glucose, UA: NEGATIVE
KETONES UA: NEGATIVE
LEUKOCYTES UA: NEGATIVE
Nitrite, UA: NEGATIVE
PH UA: 5.5
PROTEIN UA: NEGATIVE
RBC UA: NEGATIVE
Spec Grav, UA: <=1.005
Urobilinogen, UA: 0.2

## 2014-06-19 LAB — PSA: PSA: 0.74 ng/mL (ref 0.10–4.00)

## 2014-06-19 LAB — BASIC METABOLIC PANEL
BUN: 12 mg/dL (ref 6–23)
CHLORIDE: 105 meq/L (ref 96–112)
CO2: 27 mEq/L (ref 19–32)
Calcium: 9.6 mg/dL (ref 8.4–10.5)
Creatinine, Ser: 0.9 mg/dL (ref 0.4–1.5)
GFR: 96.77 mL/min (ref >60.00)
Glucose, Bld: 90 mg/dL (ref 70–99)
POTASSIUM: 4.7 meq/L (ref 3.5–5.1)
Sodium: 139 mEq/L (ref 135–145)

## 2014-06-22 ENCOUNTER — Ambulatory Visit (INDEPENDENT_AMBULATORY_CARE_PROVIDER_SITE_OTHER): Payer: PRIVATE HEALTH INSURANCE | Admitting: Family Medicine

## 2014-06-22 ENCOUNTER — Encounter: Payer: Self-pay | Admitting: Family Medicine

## 2014-06-22 VITALS — BP 130/82 | HR 92 | Temp 98.5°F | Ht 70.0 in | Wt 197.0 lb

## 2014-06-22 DIAGNOSIS — M25519 Pain in unspecified shoulder: Secondary | ICD-10-CM

## 2014-06-22 DIAGNOSIS — Z0000 Encounter for general adult medical examination without abnormal findings: Secondary | ICD-10-CM

## 2014-06-22 DIAGNOSIS — M25512 Pain in left shoulder: Secondary | ICD-10-CM

## 2014-06-22 DIAGNOSIS — E785 Hyperlipidemia, unspecified: Secondary | ICD-10-CM

## 2014-06-22 NOTE — Progress Notes (Signed)
Pre visit review using our clinic review tool, if applicable. No additional management support is needed unless otherwise documented below in the visit note. 

## 2014-06-22 NOTE — Progress Notes (Signed)
Subjective:    Patient ID: [name], male    DOB: Oct 03, 1961, 53 y.o.   MRN: [id number]  HPI Patient seen for complete physical. He is generally very healthy. He has history of some chronic anxiety and takes sertraline. Never smoked. Takes no other regular medications. History of hyperlipidemia but no family history of premature CAD. Has tried to manage this with diet previously. Tetanus up-to-date. Colonoscopy 2011 with recommended five-year followup.  Ongoing left shoulder pain. Present for several months. No specific injury. Frequent night pain. Pain with abduction and internal rotation. Possibly some mild weakness. No prior x-rays.  Past Medical History  Diagnosis Date  . CARCINOMA, BASAL CELL 05/10/2009  . HYPERLIPIDEMIA 05/10/2009  . ANXIETY, CHRONIC 10/12/2009  . Anal and rectal polyp 06/18/2010  . Acute prostatitis 05/24/2010  . Actinic keratosis 10/12/2009  . NONSPECIFIC ABN FINDING RAD & OTH EXAM GI TRACT 06/20/2010   Past Surgical History  Procedure Laterality Date  . Appendectomy  2003    reports that he has never smoked. He does not have any smokeless tobacco history on file. His alcohol and drug histories are not on file. family history includes Cancer in his mother and paternal grandfather; Diabetes in his father; Parkinsonism in his mother. No Known Allergies    Review of Systems  Constitutional: Negative for fever, activity change, appetite change and fatigue.  HENT: Negative for congestion, ear pain and trouble swallowing.   Eyes: Negative for pain and visual disturbance.  Respiratory: Negative for cough, shortness of breath and wheezing.   Cardiovascular: Negative for chest pain and palpitations.  Gastrointestinal: Negative for nausea, vomiting, abdominal pain, diarrhea, constipation, blood in stool, abdominal distention and rectal pain.  Genitourinary: Negative for dysuria, hematuria and testicular pain.  Musculoskeletal: Negative for joint swelling.  Skin:  Negative for rash.  Neurological: Negative for dizziness, syncope and headaches.  Hematological: Negative for adenopathy.  Psychiatric/Behavioral: Negative for confusion and dysphoric mood.       Objective:   Physical Exam  Constitutional: He is oriented to person, place, and time. He appears well-developed and well-nourished. No distress.  HENT:  Head: Normocephalic and atraumatic.  Right Ear: External ear normal.  Left Ear: External ear normal.  Mouth/Throat: Oropharynx is clear and moist.  Eyes: Conjunctivae and EOM are normal. Pupils are equal, round, and reactive to light.  Neck: Normal range of motion. Neck supple. No thyromegaly present.  Cardiovascular: Normal rate, regular rhythm and normal heart sounds.   No murmur heard. Pulmonary/Chest: No respiratory distress. He has no wheezes. He has no rales.  Abdominal: Soft. Bowel sounds are normal. He exhibits no distension and no mass. There is no tenderness. There is no rebound and no guarding.  Musculoskeletal: He exhibits no edema.  Left shoulder reveasl no muscle atrophy. Pain with abduction and internal rotation  Lymphadenopathy:    He has no cervical adenopathy.  Neurological: He is alert and oriented to person, place, and time. He displays normal reflexes. No cranial nerve deficit.  Skin: No rash noted.  Psychiatric: He has a normal mood and affect.          Assessment & Plan:  #1 complete physical. Labs reviewed. He has mildly elevated liver transaminases. No regular alcohol use. No risk factors for hepatitis C. Repeat in 3 months- if still elevated at that time further evaluation. He has elevated lipids and he prefers trial of weight loss and repeat in 3 months. His ten-year risk of CAD event is 8%  #  2 Left shoulder pain. ?rotator cuff tendonitis. Set up sports medicine referral

## 2014-06-27 ENCOUNTER — Encounter: Payer: PRIVATE HEALTH INSURANCE | Admitting: Family Medicine

## 2014-06-28 ENCOUNTER — Other Ambulatory Visit (INDEPENDENT_AMBULATORY_CARE_PROVIDER_SITE_OTHER): Payer: PRIVATE HEALTH INSURANCE

## 2014-06-28 ENCOUNTER — Encounter: Payer: Self-pay | Admitting: Family Medicine

## 2014-06-28 ENCOUNTER — Ambulatory Visit (INDEPENDENT_AMBULATORY_CARE_PROVIDER_SITE_OTHER): Payer: PRIVATE HEALTH INSURANCE | Admitting: Family Medicine

## 2014-06-28 VITALS — BP 140/90 | HR 104 | Ht 72.0 in | Wt 196.0 lb

## 2014-06-28 DIAGNOSIS — M75112 Incomplete rotator cuff tear or rupture of left shoulder, not specified as traumatic: Secondary | ICD-10-CM

## 2014-06-28 DIAGNOSIS — M25512 Pain in left shoulder: Secondary | ICD-10-CM

## 2014-06-28 DIAGNOSIS — M25519 Pain in unspecified shoulder: Secondary | ICD-10-CM

## 2014-06-28 DIAGNOSIS — S43429A Sprain of unspecified rotator cuff capsule, initial encounter: Secondary | ICD-10-CM

## 2014-06-28 MED ORDER — MELOXICAM 15 MG PO TABS: 15.0000 mg | ORAL_TABLET | Freq: Every day | ORAL | Status: DC

## 2014-06-28 NOTE — Assessment & Plan Note (Signed)
Patient's did not have any weakness but does have some mild partial tear. Discussed icing as well as anti-inflammatories. Prescription was given. Home exercise program was given showed proper technique. Patient will come back again in 3 weeks for further evaluation and treatment.

## 2014-06-28 NOTE — Progress Notes (Signed)
[name] [address], [place] [address] Phone: [phone] Subjective:    I'm seeing this patient by the request  of:  Eulas Post, MD   CC: left shoulder pain.   [id number] [name] is a 53 y.o. male coming in with complaint of left shoulder pain. Patient has had pain for multiple months.  He does do a lot of manual labor.  Patient states that it hurts more when he reaches behind his back or above his head. Denies any radiation and ongoing numbness. Denies any significant weakness. Patient states the severity is 6/10 and does respond mildly to over-the-counter medications. Denies any nighttime awakening. Describes the character have a dull aching.     Past medical history, social, surgical and family history all reviewed in electronic medical record.   Review of Systems: No headache, visual changes, nausea, vomiting, diarrhea, constipation, dizziness, abdominal pain, skin rash, fevers, chills, night sweats, weight loss, swollen lymph nodes, body aches, joint swelling, muscle aches, chest pain, shortness of breath, mood changes.   Objective Blood pressure 140/90, pulse 104, height 6' (1.829 m), weight 196 lb (88.905 kg), SpO2 97.00%.  General: No apparent distress alert and oriented x3 mood and affect normal, dressed appropriately.  HEENT: Pupils equal, extraocular movements intact  Respiratory: Patient's speak in full sentences and does not appear short of breath  Cardiovascular: No lower extremity edema, non tender, no erythema  Skin: Warm dry intact with no signs of infection or rash on extremities or on axial skeleton.  Abdomen: Soft nontender  Neuro: Cranial nerves II through XII are intact, neurovascularly intact in all extremities with 2+ DTRs and 2+ pulses.  Lymph: No lymphadenopathy of posterior or anterior cervical chain or axillae bilaterally.  Gait normal with good balance and coordination.  MSK:  Non tender with  full range of motion and good stability and symmetric strength and tone of  elbows, wrist, hip, knee and ankles bilaterally.  Shoulder: left Inspection reveals no abnormalities, atrophy or asymmetry. Palpation is normal with no tenderness over AC joint or bicipital groove. ROM is full in all planes passively. Rotator cuff strength normal throughout. signs of impingement with positive Neer and Hawkin's tests, but negative empty can sign. Speeds and Yergason's tests normal. No labral pathology noted with negative Obrien's, negative clunk and good stability. Normal scapular function observed. No painful arc and no drop arm sign. No apprehension sign  MSK US performed of: left This study was ordered, performed, and interpreted by Charlann Boxer D.O.  Shoulder:   Supraspinatus:  Appears normal on long and transverse views, Bursal bulge seen with shoulder abduction on impingement view. Infraspinatus:  Appears normal on long and transverse views. Significant increase in Doppler flow Subscapularis:  Patient does have a very small less than 10 tear of a partial tear at the insertion of the subscapularis. Teres Minor:  Appears normal on long and transverse views. AC joint:  Capsule undistended, no geyser sign. Glenohumeral Joint:  Appears normal without effusion. Glenoid Labrum:  Intact does have areas hypoechoic changes that could be a potential tear difficult to assess Biceps Tendon:  Appears normal on long and transverse views, no fraying of tendon, tendon located in intertubercular groove, no subluxation with shoulder internal or external rotation.  Impression: Subacromial bursitis with small partial tear of subscapularis and questionable labral tear  Procedure: Real-time Ultrasound Guided Injection of left glenohumeral joint Device: GE Logiq E  Ultrasound guided injection is preferred based studies  that show increased duration, increased effect, greater accuracy, decreased procedural pain,  increased response rate with ultrasound guided versus blind injection.  Verbal informed consent obtained.  Time-out conducted.  Noted no overlying erythema, induration, or other signs of local infection.  Skin prepped in a sterile fashion.  Local anesthesia: Topical Ethyl chloride.  With sterile technique and under real time ultrasound guidance:  Joint visualized.  23g 1  inch needle inserted posterior approach. Pictures taken for needle placement. Patient did have injection of 2 cc of 1% lidocaine, 2 cc of 0.5% Marcaine, and 1.0 cc of Kenalog 40 mg/dL. Completed without difficulty  Pain immediately resolved suggesting accurate placement of the medication.  Advised to call if fevers/chills, erythema, induration, drainage, or persistent bleeding.  Images permanently stored and available for review in the ultrasound unit.  Impression: Technically successful ultrasound guided injection.   Impression and Recommendations:     This case required medical decision making of moderate complexity.

## 2014-06-28 NOTE — Patient Instructions (Signed)
Good to see you Ice 20 minutes after activity and before bed.  Exercises 3 times a week.  Meloxicam daily for 10 days  Come back in 3 weeks.

## 2014-07-07 ENCOUNTER — Encounter: Payer: Self-pay | Admitting: Family Medicine

## 2014-07-19 ENCOUNTER — Ambulatory Visit: Payer: PRIVATE HEALTH INSURANCE | Admitting: Family Medicine

## 2014-07-19 ENCOUNTER — Telehealth: Payer: Self-pay | Admitting: Family Medicine

## 2014-07-19 DIAGNOSIS — Z0289 Encounter for other administrative examinations: Secondary | ICD-10-CM

## 2014-07-19 NOTE — Telephone Encounter (Signed)
Patient no showed for 3 week fu on 8/12.  Please advise.

## 2014-07-20 NOTE — Telephone Encounter (Signed)
Noted  

## 2014-10-03 ENCOUNTER — Other Ambulatory Visit: Payer: Self-pay | Admitting: Family Medicine

## 2015-02-14 ENCOUNTER — Ambulatory Visit (INDEPENDENT_AMBULATORY_CARE_PROVIDER_SITE_OTHER): Payer: PRIVATE HEALTH INSURANCE | Admitting: Family Medicine

## 2015-02-14 ENCOUNTER — Encounter: Payer: Self-pay | Admitting: Family Medicine

## 2015-02-14 VITALS — BP 136/86 | HR 100 | Temp 98.6°F | Wt 193.0 lb

## 2015-02-14 DIAGNOSIS — J329 Chronic sinusitis, unspecified: Secondary | ICD-10-CM

## 2015-02-14 MED ORDER — AMOXICILLIN-POT CLAVULANATE 875-125 MG PO TABS: 1.0000 | ORAL_TABLET | Freq: Two times a day (BID) | ORAL | Status: DC

## 2015-02-14 NOTE — Patient Instructions (Signed)

## 2015-02-14 NOTE — Progress Notes (Signed)
Pre visit review using our clinic review tool, if applicable. No additional management support is needed unless otherwise documented below in the visit note. 

## 2015-02-14 NOTE — Progress Notes (Signed)
   Subjective:    Patient ID: [name], male    DOB: 08-25-1961, 54 y.o.   MRN: [id number]  HPI Patient seen with several week history of some sinus pressure and low-grade headache. Past few days had low-grade fever. Back in January had several weeks of sinusitis symptoms went to urgent care and prescribed amoxicillin. He then returned in February with sore throat diagnosed with strep and again treated with amoxicillin. Currently has maxillary and frontal sinus pressure and low-grade fever all with headaches and malaise. Colored nasal discharge. Occasional cough. No nausea or vomiting. Nonsmoker.  Past Medical History  Diagnosis Date  . CARCINOMA, BASAL CELL 05/10/2009  . HYPERLIPIDEMIA 05/10/2009  . ANXIETY, CHRONIC 10/12/2009  . Anal and rectal polyp 06/18/2010  . Acute prostatitis 05/24/2010  . Actinic keratosis 10/12/2009  . NONSPECIFIC ABN FINDING RAD & OTH EXAM GI TRACT 06/20/2010   Past Surgical History  Procedure Laterality Date  . Appendectomy  2003    reports that he has never smoked. He does not have any smokeless tobacco history on file. His alcohol and drug histories are not on file. family history includes Cancer in his mother and paternal grandfather; Diabetes in his father; Parkinsonism in his mother. No Known Allergies    Review of Systems  Constitutional: Positive for fatigue. Negative for fever and chills.  HENT: Positive for congestion and sinus pressure. Negative for ear pain.   Respiratory: Positive for cough.   Neurological: Positive for headaches.       Objective:   Physical Exam  Constitutional: He appears well-developed and well-nourished.  HENT:  Right Ear: External ear normal.  Left Ear: External ear normal.  Mouth/Throat: Oropharynx is clear and moist.  Erythematous nasal mucosa.  Neck: Neck supple.  Cardiovascular: Normal rate and regular rhythm.   Pulmonary/Chest: Effort normal and breath sounds normal. No respiratory distress. He has no  wheezes. He has no rales.  Lymphadenopathy:    He has no cervical adenopathy.          Assessment & Plan:  Recurrent versus chronic sinusitis. Augmentin 875 mg twice a day for 10 days to add anaerobic coverage. Consider sinus films if no better after completing this

## 2015-04-02 ENCOUNTER — Other Ambulatory Visit: Payer: PRIVATE HEALTH INSURANCE

## 2015-04-03 ENCOUNTER — Other Ambulatory Visit (INDEPENDENT_AMBULATORY_CARE_PROVIDER_SITE_OTHER): Payer: PRIVATE HEALTH INSURANCE

## 2015-04-03 DIAGNOSIS — Z0000 Encounter for general adult medical examination without abnormal findings: Secondary | ICD-10-CM

## 2015-04-03 LAB — CBC WITH DIFFERENTIAL/PLATELET
Basophils Absolute: 0 10*3/uL (ref 0.0–0.1)
Basophils Relative: 0.8 % (ref 0.0–3.0)
EOS ABS: 0.2 10*3/uL (ref 0.0–0.7)
Eosinophils Relative: 3.1 % (ref 0.0–5.0)
HCT: 43 % (ref 39.0–52.0)
HEMOGLOBIN: 14.7 g/dL (ref 13.0–17.0)
LYMPHS ABS: 2.5 10*3/uL (ref 0.7–4.0)
Lymphocytes Relative: 47 % — ABNORMAL HIGH (ref 12.0–46.0)
MCHC: 34.3 g/dL (ref 30.0–36.0)
MCV: 92.5 fl (ref 78.0–100.0)
Monocytes Absolute: 0.5 10*3/uL (ref 0.1–1.0)
Monocytes Relative: 8.5 % (ref 3.0–12.0)
NEUTROS ABS: 2.2 10*3/uL (ref 1.4–7.7)
NEUTROS PCT: 40.6 % — AB (ref 43.0–77.0)
Platelets: 302 10*3/uL (ref 150.0–400.0)
RBC: 4.65 Mil/uL (ref 4.22–5.81)
RDW: 13.7 % (ref 11.5–15.5)
WBC: 5.4 10*3/uL (ref 4.0–10.5)

## 2015-04-03 LAB — BASIC METABOLIC PANEL
BUN: 11 mg/dL (ref 6–23)
CHLORIDE: 105 meq/L (ref 96–112)
CO2: 29 meq/L (ref 19–32)
Calcium: 9.6 mg/dL (ref 8.4–10.5)
Creatinine, Ser: 0.84 mg/dL (ref 0.40–1.50)
GFR: 101.79 mL/min (ref >60.00)
GLUCOSE: 90 mg/dL (ref 70–99)
POTASSIUM: 4.6 meq/L (ref 3.5–5.1)
Sodium: 139 mEq/L (ref 135–145)

## 2015-04-03 LAB — HEPATIC FUNCTION PANEL
ALT: 29 U/L (ref 0–53)
AST: 24 U/L (ref 0–37)
Albumin: 4.4 g/dL (ref 3.5–5.2)
Alkaline Phosphatase: 92 U/L (ref 39–117)
BILIRUBIN TOTAL: 0.5 mg/dL (ref 0.2–1.2)
Bilirubin, Direct: 0.1 mg/dL (ref 0.0–0.3)
Total Protein: 6.9 g/dL (ref 6.0–8.3)

## 2015-04-03 LAB — LIPID PANEL
Cholesterol: 171 mg/dL (ref 0–200)
HDL: 45.8 mg/dL (ref >39.00)
LDL CALC: 106 mg/dL — AB (ref 0–99)
NonHDL: 125.2
Total CHOL/HDL Ratio: 4
Triglycerides: 98 mg/dL (ref 0.0–149.0)
VLDL: 19.6 mg/dL (ref 0.0–40.0)

## 2015-04-03 LAB — TSH: TSH: 1.5 u[IU]/mL (ref 0.35–4.50)

## 2015-04-03 LAB — PSA: PSA: 0.61 ng/mL (ref 0.10–4.00)

## 2015-04-09 ENCOUNTER — Encounter: Payer: Self-pay | Admitting: Family Medicine

## 2015-04-09 ENCOUNTER — Ambulatory Visit (INDEPENDENT_AMBULATORY_CARE_PROVIDER_SITE_OTHER): Payer: PRIVATE HEALTH INSURANCE | Admitting: Family Medicine

## 2015-04-09 VITALS — BP 130/80 | HR 90 | Temp 98.1°F | Ht 70.0 in | Wt 189.0 lb

## 2015-04-09 DIAGNOSIS — Z0000 Encounter for general adult medical examination without abnormal findings: Secondary | ICD-10-CM

## 2015-04-09 NOTE — Progress Notes (Signed)
Pre visit review using our clinic review tool, if applicable. No additional management support is needed unless otherwise documented below in the visit note. 

## 2015-04-09 NOTE — Progress Notes (Signed)
   Subjective:    Patient ID: [name], male    DOB: 1961/05/03, 54 y.o.   MRN: [id number]  HPI   Patient is seen for physical.  Tetanus 2009. Colonoscopy about 5 years ago which showed hyperplastic polyps.  He started exercising much more diligently this year and has scaled back fast food and has made some significant dietary changes.  Past Medical History  Diagnosis Date  . CARCINOMA, BASAL CELL 05/10/2009  . HYPERLIPIDEMIA 05/10/2009  . ANXIETY, CHRONIC 10/12/2009  . Anal and rectal polyp 06/18/2010  . Acute prostatitis 05/24/2010  . Actinic keratosis 10/12/2009  . NONSPECIFIC ABN FINDING RAD & OTH EXAM GI TRACT 06/20/2010   Past Surgical History  Procedure Laterality Date  . Appendectomy  2003    reports that he has never smoked. He does not have any smokeless tobacco history on file. His alcohol and drug histories are not on file. family history includes Cancer in his mother and paternal grandfather; Diabetes in his father; Parkinsonism in his mother. No Known Allergies    Review of Systems  Constitutional: Negative for fever, activity change, appetite change and fatigue.  HENT: Negative for congestion, ear pain and trouble swallowing.   Eyes: Negative for pain and visual disturbance.  Respiratory: Negative for cough, shortness of breath and wheezing.   Cardiovascular: Negative for chest pain and palpitations.  Gastrointestinal: Negative for nausea, vomiting, abdominal pain, diarrhea, constipation, blood in stool, abdominal distention and rectal pain.  Genitourinary: Negative for dysuria, hematuria and testicular pain.  Musculoskeletal: Negative for joint swelling and arthralgias.  Skin: Negative for rash.  Neurological: Negative for dizziness, syncope and headaches.  Hematological: Negative for adenopathy.  Psychiatric/Behavioral: Negative for confusion and dysphoric mood.       Objective:   Physical Exam  Constitutional: He is oriented to person, place, and time. He  appears well-developed and well-nourished. No distress.  HENT:  Head: Normocephalic and atraumatic.  Right Ear: External ear normal.  Left Ear: External ear normal.  Mouth/Throat: Oropharynx is clear and moist.  Eyes: Conjunctivae and EOM are normal. Pupils are equal, round, and reactive to light.  Neck: Normal range of motion. Neck supple. No thyromegaly present.  Cardiovascular: Normal rate, regular rhythm and normal heart sounds.   No murmur heard. Pulmonary/Chest: No respiratory distress. He has no wheezes. He has no rales.  Abdominal: Soft. Bowel sounds are normal. He exhibits no distension and no mass. There is no tenderness. There is no rebound and no guarding.  Musculoskeletal: He exhibits no edema.  Lymphadenopathy:    He has no cervical adenopathy.  Neurological: He is alert and oriented to person, place, and time. He displays normal reflexes. No cranial nerve deficit.  Skin: No rash noted.  Psychiatric: He has a normal mood and affect.          Assessment & Plan:  Complete physical. Labs reviewed. Lipids are significantly improved. Continue regular exercise habits. He'll contact us if he has not been notified by GI later this year regarding repeat colonoscopy

## 2015-04-12 ENCOUNTER — Encounter: Payer: Self-pay | Admitting: Gastroenterology

## 2015-04-14 ENCOUNTER — Other Ambulatory Visit: Payer: Self-pay | Admitting: Family Medicine

## 2015-06-14 ENCOUNTER — Encounter: Payer: Self-pay | Admitting: Internal Medicine

## 2015-06-14 ENCOUNTER — Encounter: Payer: Self-pay | Admitting: Gastroenterology

## 2015-09-14 ENCOUNTER — Encounter: Payer: Self-pay | Admitting: Gastroenterology

## 2015-09-21 ENCOUNTER — Encounter: Payer: PRIVATE HEALTH INSURANCE | Admitting: Gastroenterology

## 2015-10-30 ENCOUNTER — Ambulatory Visit (AMBULATORY_SURGERY_CENTER): Payer: Self-pay

## 2015-10-30 VITALS — Ht 71.0 in | Wt 203.4 lb

## 2015-10-30 DIAGNOSIS — Z860100 Personal history of colon polyps, unspecified: Secondary | ICD-10-CM

## 2015-10-30 MED ORDER — SUPREP BOWEL PREP KIT 17.5-3.13-1.6 GM/177ML PO SOLN: 1.0000 | Freq: Once | ORAL | Status: DC

## 2015-10-30 NOTE — Progress Notes (Signed)
No allergies to eggs or soy No past problems with anesthesia No home oxygen No diet/weight loss  Has email and internet; registered for emmi

## 2015-11-13 ENCOUNTER — Encounter: Payer: Self-pay | Admitting: Internal Medicine

## 2015-11-13 ENCOUNTER — Ambulatory Visit (AMBULATORY_SURGERY_CENTER): Payer: PRIVATE HEALTH INSURANCE | Admitting: Internal Medicine

## 2015-11-13 VITALS — BP 118/75 | HR 64 | Temp 97.8°F | Resp 22 | Ht 71.0 in | Wt 203.0 lb

## 2015-11-13 DIAGNOSIS — K635 Polyp of colon: Secondary | ICD-10-CM

## 2015-11-13 DIAGNOSIS — Z86010 Personal history of colonic polyps: Secondary | ICD-10-CM

## 2015-11-13 DIAGNOSIS — Z1211 Encounter for screening for malignant neoplasm of colon: Secondary | ICD-10-CM | POA: Diagnosis not present

## 2015-11-13 DIAGNOSIS — Z800 Family history of malignant neoplasm of digestive organs: Secondary | ICD-10-CM

## 2015-11-13 DIAGNOSIS — D122 Benign neoplasm of ascending colon: Secondary | ICD-10-CM | POA: Diagnosis not present

## 2015-11-13 MED ORDER — SODIUM CHLORIDE 0.9 % IV SOLN: 500.0000 mL | INTRAVENOUS | Status: DC

## 2015-11-13 NOTE — Progress Notes (Signed)
Report to PACU, RN, vss, BBS= Clear.  

## 2015-11-13 NOTE — Patient Instructions (Signed)

## 2015-11-13 NOTE — Op Note (Signed)
[place]  [address]. [place], [address]   COLONOSCOPY PROCEDURE REPORT  PATIENT: [name], [name]  MR#: [id number] BIRTHDATE: 08/22/1961 , 54  yrs. old GENDER: male ENDOSCOPIST: Eustace Quail, MD REFERRED [id number] Recall, PROCEDURE DATE:  11/13/2015 PROCEDURE:   Colonoscopy, screening and Colonoscopy with snare polypectomy X1 First Screening Colonoscopy - Avg.  risk and is 50 yrs.  old or older - No.  Prior Negative Screening - Now for repeat screening. Less than 10 yrs Prior Negative Screening - Now for repeat screening.  Above average risk  History of Adenoma - Now for follow-up colonoscopy & has been > or = to 3 yrs.  N/A  Polyps removed today? Yes ASA CLASS:   Class I INDICATIONS:Screening for colonic neoplasia and FH Colon or Rectal Adenocarcinoma.  . Mother with colon cancer in 61s area patient's index examination July 2011 ([facility] only) MEDICATIONS: Monitored anesthesia care and Propofol 400 mg IV  DESCRIPTION OF PROCEDURE:   After the risks benefits and alternatives of the procedure were thoroughly explained, informed consent was obtained.  The digital rectal exam revealed no abnormalities of the rectum.   The LB [id number]  endoscope was introduced through the anus and advanced to the cecum, which was identified by both the appendix and ileocecal valve. No adverse events experienced.   The quality of the prep was good.  (Suprep was used)  The instrument was then slowly withdrawn as the colon was fully examined. Estimated blood loss is zero unless otherwise noted in this procedure report.  COLON FINDINGS: A single polyp measuring 3 mm in size was found in the ascending colon.  A polypectomy was performed with a cold snare.  The resection was complete, the polyp tissue was completely retrieved and sent to histology.   The examination was otherwise normal.  Retroflexed views revealed internal hemorrhoids. The time to cecum = 1.8  Withdrawal time = 8.7   The scope was withdrawn and the procedure completed. COMPLICATIONS: There were no immediate complications.  ENDOSCOPIC IMPRESSION: 1.   Single polyp was found in the ascending colon; polypectomy was performed with a cold snare 2.   The examination was otherwise normal  RECOMMENDATIONS: 1. Follow up colonoscopy in 5 years  eSigned:  Eustace Quail, MD 11/13/2015 4:34 PM   cc: The Patient and Carolann Littler, MD

## 2015-11-13 NOTE — Progress Notes (Signed)
Called to room to assist during endoscopic procedure.  Patient ID and intended procedure confirmed with present staff. Received instructions for my participation in the procedure from the performing physician.  

## 2015-11-14 ENCOUNTER — Telehealth: Payer: Self-pay | Admitting: *Deleted

## 2015-11-14 NOTE — Telephone Encounter (Signed)
  Follow up Call-  Call back number 11/13/2015  Post procedure Call Back phone  # [phone]  Permission to leave phone message Yes      no answer, left message .

## 2015-11-19 ENCOUNTER — Encounter: Payer: Self-pay | Admitting: Internal Medicine

## 2016-01-18 ENCOUNTER — Other Ambulatory Visit: Payer: Self-pay | Admitting: Family Medicine

## 2016-08-06 DIAGNOSIS — H6501 Acute serous otitis media, right ear: Secondary | ICD-10-CM | POA: Diagnosis not present

## 2016-08-06 DIAGNOSIS — J0100 Acute maxillary sinusitis, unspecified: Secondary | ICD-10-CM | POA: Diagnosis not present

## 2016-08-08 ENCOUNTER — Encounter: Payer: Self-pay | Admitting: Family Medicine

## 2016-08-08 ENCOUNTER — Ambulatory Visit (INDEPENDENT_AMBULATORY_CARE_PROVIDER_SITE_OTHER): Payer: BC Managed Care – PPO | Admitting: Family Medicine

## 2016-08-08 VITALS — BP 134/92 | HR 107 | Temp 99.3°F | Ht 71.0 in | Wt 196.0 lb

## 2016-08-08 DIAGNOSIS — J029 Acute pharyngitis, unspecified: Secondary | ICD-10-CM

## 2016-08-08 MED ORDER — CEPHALEXIN 500 MG PO CAPS: 500.0000 mg | ORAL_CAPSULE | Freq: Three times a day (TID) | ORAL | 0 refills | Status: DC

## 2016-08-08 NOTE — Patient Instructions (Signed)
Follow up by next week if fever and sore throat not resolving.

## 2016-08-08 NOTE — Progress Notes (Signed)
Subjective:     Patient ID: [name], male   DOB: November 26, 1961, 55 y.o.   MRN: [id number]  HPI Acute visit for sore throat. He states Tuesday night he had fever 100.4. He went to local urgent care up in [place] couple days ago and was diagnosed with "right ear infection ".  Placed on amoxicillin. It is not feeling better. He still has sore throat. Still has some right ear pain. He feels he may have some low-grade fever still. No cough. No nausea or vomiting. Minimal nasal congestion. He's noted some lymphadenopathy in the neck Patient denies having any strep test done at urgent care recently  Past Medical History:  Diagnosis Date  . Actinic keratosis 10/12/2009  . Acute prostatitis 05/24/2010  . Anal and rectal polyp 06/18/2010  . ANXIETY, CHRONIC 10/12/2009  . CARCINOMA, BASAL CELL 05/10/2009  . HYPERLIPIDEMIA 05/10/2009  . NONSPECIFIC ABN FINDING RAD & OTH EXAM GI TRACT 06/20/2010   Past Surgical History:  Procedure Laterality Date  . APPENDECTOMY  2003    reports that he has never smoked. He has never used smokeless tobacco. He reports that he drinks alcohol. He reports that he does not use drugs. family history includes Cancer in his mother and paternal grandfather; Diabetes in his father; Parkinsonism in his mother. No Known Allergies   Review of Systems  Constitutional: Positive for chills, fatigue and fever.  HENT: Positive for sore throat.   Respiratory: Negative for cough.   Gastrointestinal: Negative for nausea and vomiting.       Objective:   Physical Exam  Constitutional: He appears well-developed and well-nourished.  HENT:  Right Ear: External ear normal.  Left Ear: External ear normal.  Oropharynx is erythematous. He has some minimal yellowish exudate of both tonsils. No evidence or peritonsillar abscess  Neck: Neck supple.  Minimal anterior cervical adenopathy. No posterior cervical nodes.  Cardiovascular: Normal rate and regular rhythm.   Pulmonary/Chest:  Effort normal and breath sounds normal. No respiratory distress. He has no wheezes. He has no rales.  Lymphadenopathy:    He has cervical adenopathy.  Skin: No rash noted.       Assessment:     Sore throat. Patient not improving on amoxicillin. Differential is viral pharyngitis versus possible group A strep not responding to amoxicillin. We discussed possible throat culture or rapid testing but feel results would be skewed by the fact he is currently already on amoxicillin    Plan:     -Change antibiotic to Keflex 500 mg 3 times a day for 7 days. -Stay well-hydrated -Continue Advil or Tylenol for symptom relief  Eulas Post MD [facility]

## 2016-09-25 ENCOUNTER — Ambulatory Visit (INDEPENDENT_AMBULATORY_CARE_PROVIDER_SITE_OTHER): Payer: BC Managed Care – PPO | Admitting: *Deleted

## 2016-09-25 DIAGNOSIS — Z23 Encounter for immunization: Secondary | ICD-10-CM | POA: Diagnosis not present

## 2016-11-28 ENCOUNTER — Other Ambulatory Visit: Payer: Self-pay | Admitting: Family Medicine

## 2017-03-30 ENCOUNTER — Other Ambulatory Visit: Payer: BC Managed Care – PPO

## 2017-04-10 ENCOUNTER — Ambulatory Visit (INDEPENDENT_AMBULATORY_CARE_PROVIDER_SITE_OTHER): Payer: BC Managed Care – PPO | Admitting: Family Medicine

## 2017-04-10 ENCOUNTER — Encounter: Payer: Self-pay | Admitting: Family Medicine

## 2017-04-10 VITALS — BP 140/98 | HR 90 | Temp 97.8°F | Ht 70.0 in | Wt 195.8 lb

## 2017-04-10 DIAGNOSIS — Z0000 Encounter for general adult medical examination without abnormal findings: Secondary | ICD-10-CM | POA: Diagnosis not present

## 2017-04-10 LAB — HEPATIC FUNCTION PANEL
ALBUMIN: 4.8 g/dL (ref 3.5–5.2)
ALK PHOS: 92 U/L (ref 39–117)
ALT: 53 U/L (ref 0–53)
AST: 39 U/L — AB (ref 0–37)
Bilirubin, Direct: 0.1 mg/dL (ref 0.0–0.3)
TOTAL PROTEIN: 7.4 g/dL (ref 6.0–8.3)
Total Bilirubin: 0.7 mg/dL (ref 0.2–1.2)

## 2017-04-10 LAB — CBC WITH DIFFERENTIAL/PLATELET
Basophils Absolute: 0.1 10*3/uL (ref 0.0–0.1)
Basophils Relative: 1 % (ref 0.0–3.0)
EOS PCT: 3 % (ref 0.0–5.0)
Eosinophils Absolute: 0.2 10*3/uL (ref 0.0–0.7)
HCT: 45 % (ref 39.0–52.0)
HEMOGLOBIN: 15.4 g/dL (ref 13.0–17.0)
Lymphocytes Relative: 45.7 % (ref 12.0–46.0)
Lymphs Abs: 2.7 10*3/uL (ref 0.7–4.0)
MCHC: 34.3 g/dL (ref 30.0–36.0)
MCV: 96 fl (ref 78.0–100.0)
Monocytes Absolute: 0.5 10*3/uL (ref 0.1–1.0)
Monocytes Relative: 8.8 % (ref 3.0–12.0)
Neutro Abs: 2.4 10*3/uL (ref 1.4–7.7)
Neutrophils Relative %: 41.5 % — ABNORMAL LOW (ref 43.0–77.0)
Platelets: 282 10*3/uL (ref 150.0–400.0)
RBC: 4.69 Mil/uL (ref 4.22–5.81)
RDW: 12.9 % (ref 11.5–15.5)
WBC: 5.8 10*3/uL (ref 4.0–10.5)

## 2017-04-10 LAB — LIPID PANEL
Cholesterol: 241 mg/dL — ABNORMAL HIGH (ref 0–200)
HDL: 57.1 mg/dL (ref >39.00)
LDL Cholesterol: 152 mg/dL — ABNORMAL HIGH (ref 0–99)
NONHDL: 184.18
Total CHOL/HDL Ratio: 4
Triglycerides: 162 mg/dL — ABNORMAL HIGH (ref 0.0–149.0)
VLDL: 32.4 mg/dL (ref 0.0–40.0)

## 2017-04-10 LAB — BASIC METABOLIC PANEL
BUN: 8 mg/dL (ref 6–23)
CO2: 25 mEq/L (ref 19–32)
Calcium: 9.8 mg/dL (ref 8.4–10.5)
Chloride: 102 mEq/L (ref 96–112)
Creatinine, Ser: 0.86 mg/dL (ref 0.40–1.50)
GFR: 98.31 mL/min (ref >60.00)
GLUCOSE: 86 mg/dL (ref 70–99)
Potassium: 4.2 mEq/L (ref 3.5–5.1)
SODIUM: 136 meq/L (ref 135–145)

## 2017-04-10 LAB — TSH: TSH: 1.98 u[IU]/mL (ref 0.35–4.50)

## 2017-04-10 LAB — PSA: PSA: 0.68 ng/mL (ref 0.10–4.00)

## 2017-04-10 NOTE — Patient Instructions (Signed)
Monitor blood pressure at home and be in touch if consistently > 140/90. 

## 2017-04-10 NOTE — Progress Notes (Signed)
Subjective:     Patient ID: [name], male   DOB: [birth year], 56 y.o.   MRN: [id number]  HPI Patient here for physical exam. He has long-standing history of chronic anxiety which has been controlled fairly well with sertraline. He has long history of "whitecoat syndrome ".   Blood pressures tend to be very well-controlled at home. 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  He is married and this is second marriage. He has total of 5 daughters from both marriages. Never smoked. No regular alcohol use.  Tetanus up-to-date. No history of hepatitis C screening.  Past Medical History:  Diagnosis Date  . Actinic keratosis 10/12/2009  . Acute prostatitis 05/24/2010  . Anal and rectal polyp 06/18/2010  . ANXIETY, CHRONIC 10/12/2009  . CARCINOMA, BASAL CELL 05/10/2009  . HYPERLIPIDEMIA 05/10/2009  . NONSPECIFIC ABN FINDING RAD & OTH EXAM GI TRACT 06/20/2010   Past Surgical History:  Procedure Laterality Date  . APPENDECTOMY  2003    reports that he has never smoked. He has never used smokeless tobacco. He reports that he drinks alcohol. He reports that he does not use drugs. family history includes Cancer in his mother and paternal grandfather; Diabetes in his father; Parkinsonism in his mother. No Known Allergies   Review of Systems  Constitutional: Negative for activity change, appetite change, fatigue and fever.  HENT: Negative for congestion, ear pain and trouble swallowing.   Eyes: Negative for pain and visual disturbance.  Respiratory: Negative for cough, shortness of breath and wheezing.   Cardiovascular: Negative for chest pain and palpitations.  Gastrointestinal: Negative for abdominal distention, abdominal pain, blood in stool, constipation, diarrhea, nausea, rectal pain and vomiting.  Genitourinary: Negative for dysuria, hematuria and testicular pain.  Musculoskeletal: Negative for arthralgias and joint swelling.  Skin: Negative for rash.   Neurological: Negative for dizziness, syncope and headaches.  Hematological: Negative for adenopathy.  Psychiatric/Behavioral: Negative for confusion and dysphoric mood.       Objective:   Physical Exam  Constitutional: He is oriented to person, place, and time. He appears well-developed and well-nourished. No distress.  HENT:  Head: Normocephalic and atraumatic.  Right Ear: External ear normal.  Left Ear: External ear normal.  Mouth/Throat: Oropharynx is clear and moist.  Eyes: Conjunctivae and EOM are normal. Pupils are equal, round, and reactive to light.  Neck: Normal range of motion. Neck supple. No thyromegaly present.  Cardiovascular: Normal rate, regular rhythm and normal heart sounds.   No murmur heard. Pulmonary/Chest: No respiratory distress. He has no wheezes. He has no rales.  Abdominal: Soft. Bowel sounds are normal. He exhibits no distension and no mass. There is no tenderness. There is no rebound and no guarding.  Musculoskeletal: He exhibits no edema.  Lymphadenopathy:    He has no cervical adenopathy.  Neurological: He is alert and oriented to person, place, and time. He displays normal reflexes. No cranial nerve deficit.  Skin: No rash noted.  Psychiatric: He has a normal mood and affect.       Assessment:     Physical exam. Blood pressure slightly elevated today but he has history of whitecoat syndrome    Plan:     -Obtain screening lab work. Include hepatitis C antibody- though he is low risk -Monitor blood pressure at home and be in touch if consistently greater than 140/90 -He is getting every five-year colonoscopies because of family history of colon cancer in his mother  Alinda Sierras  Maeven Mcdougall MD [facility] at [facility]

## 2017-04-10 NOTE — Progress Notes (Signed)
Pre visit review using our clinic review tool, if applicable. No additional management support is needed unless otherwise documented below in the visit note. 

## 2017-04-11 LAB — HEPATITIS C ANTIBODY: HCV AB: NEGATIVE

## 2017-05-21 ENCOUNTER — Other Ambulatory Visit: Payer: Self-pay | Admitting: Family Medicine

## 2017-09-30 DIAGNOSIS — D225 Melanocytic nevi of trunk: Secondary | ICD-10-CM | POA: Diagnosis not present

## 2017-09-30 DIAGNOSIS — L570 Actinic keratosis: Secondary | ICD-10-CM | POA: Diagnosis not present

## 2017-09-30 DIAGNOSIS — Z1283 Encounter for screening for malignant neoplasm of skin: Secondary | ICD-10-CM | POA: Diagnosis not present

## 2017-09-30 DIAGNOSIS — D485 Neoplasm of uncertain behavior of skin: Secondary | ICD-10-CM | POA: Diagnosis not present

## 2017-09-30 DIAGNOSIS — X32XXXA Exposure to sunlight, initial encounter: Secondary | ICD-10-CM | POA: Diagnosis not present

## 2017-10-02 ENCOUNTER — Ambulatory Visit (INDEPENDENT_AMBULATORY_CARE_PROVIDER_SITE_OTHER): Payer: BC Managed Care – PPO | Admitting: *Deleted

## 2017-10-02 DIAGNOSIS — Z23 Encounter for immunization: Secondary | ICD-10-CM | POA: Diagnosis not present

## 2018-04-13 ENCOUNTER — Other Ambulatory Visit: Payer: Self-pay | Admitting: Family Medicine

## 2018-05-10 ENCOUNTER — Ambulatory Visit (INDEPENDENT_AMBULATORY_CARE_PROVIDER_SITE_OTHER): Payer: BC Managed Care – PPO | Admitting: Family Medicine

## 2018-05-10 ENCOUNTER — Encounter: Payer: Self-pay | Admitting: Family Medicine

## 2018-05-10 VITALS — BP 140/84 | HR 89 | Temp 97.9°F | Ht 70.0 in | Wt 201.8 lb

## 2018-05-10 DIAGNOSIS — Z0000 Encounter for general adult medical examination without abnormal findings: Secondary | ICD-10-CM | POA: Diagnosis not present

## 2018-05-10 DIAGNOSIS — Z23 Encounter for immunization: Secondary | ICD-10-CM

## 2018-05-10 DIAGNOSIS — R5383 Other fatigue: Secondary | ICD-10-CM

## 2018-05-10 DIAGNOSIS — Z125 Encounter for screening for malignant neoplasm of prostate: Secondary | ICD-10-CM | POA: Diagnosis not present

## 2018-05-10 LAB — CBC WITH DIFFERENTIAL/PLATELET
BASOS PCT: 1 % (ref 0.0–3.0)
Basophils Absolute: 0.1 10*3/uL (ref 0.0–0.1)
Eosinophils Absolute: 0.1 10*3/uL (ref 0.0–0.7)
Eosinophils Relative: 1.7 % (ref 0.0–5.0)
HEMATOCRIT: 45.8 % (ref 39.0–52.0)
Hemoglobin: 15.7 g/dL (ref 13.0–17.0)
LYMPHS PCT: 35 % (ref 12.0–46.0)
Lymphs Abs: 1.9 10*3/uL (ref 0.7–4.0)
MCHC: 34.3 g/dL (ref 30.0–36.0)
MCV: 95.5 fl (ref 78.0–100.0)
Monocytes Absolute: 0.5 10*3/uL (ref 0.1–1.0)
Monocytes Relative: 9 % (ref 3.0–12.0)
NEUTROS ABS: 2.9 10*3/uL (ref 1.4–7.7)
Neutrophils Relative %: 53.3 % (ref 43.0–77.0)
PLATELETS: 280 10*3/uL (ref 150.0–400.0)
RBC: 4.79 Mil/uL (ref 4.22–5.81)
RDW: 13.1 % (ref 11.5–15.5)
WBC: 5.5 10*3/uL (ref 4.0–10.5)

## 2018-05-10 LAB — LIPID PANEL
CHOLESTEROL: 276 mg/dL — AB (ref 0–200)
HDL: 48.1 mg/dL (ref >39.00)
NonHDL: 227.99
TRIGLYCERIDES: 254 mg/dL — AB (ref 0.0–149.0)
Total CHOL/HDL Ratio: 6
VLDL: 50.8 mg/dL — AB (ref 0.0–40.0)

## 2018-05-10 LAB — HEPATIC FUNCTION PANEL
ALBUMIN: 4.6 g/dL (ref 3.5–5.2)
ALT: 79 U/L — AB (ref 0–53)
AST: 48 U/L — ABNORMAL HIGH (ref 0–37)
Alkaline Phosphatase: 84 U/L (ref 39–117)
Bilirubin, Direct: 0.1 mg/dL (ref 0.0–0.3)
Total Bilirubin: 0.7 mg/dL (ref 0.2–1.2)
Total Protein: 7.4 g/dL (ref 6.0–8.3)

## 2018-05-10 LAB — BASIC METABOLIC PANEL
BUN: 10 mg/dL (ref 6–23)
CO2: 27 mEq/L (ref 19–32)
Calcium: 9.8 mg/dL (ref 8.4–10.5)
Chloride: 103 mEq/L (ref 96–112)
Creatinine, Ser: 0.87 mg/dL (ref 0.40–1.50)
GFR: 96.62 mL/min (ref >60.00)
Glucose, Bld: 104 mg/dL — ABNORMAL HIGH (ref 70–99)
POTASSIUM: 4.5 meq/L (ref 3.5–5.1)
Sodium: 138 mEq/L (ref 135–145)

## 2018-05-10 LAB — TESTOSTERONE: Testosterone: 254.83 ng/dL — ABNORMAL LOW (ref 300.00–890.00)

## 2018-05-10 LAB — TSH: TSH: 2.46 u[IU]/mL (ref 0.35–4.50)

## 2018-05-10 LAB — PSA: PSA: 0.57 ng/mL (ref 0.10–4.00)

## 2018-05-10 LAB — LDL CHOLESTEROL, DIRECT: LDL DIRECT: 188 mg/dL

## 2018-05-10 NOTE — Progress Notes (Signed)
  Subjective:     Patient ID: [name], male   DOB: Dec 12, 1960, 57 y.o.   MRN: [id number]  HPI Patient seen for physical exam. Generally very healthy. He has history of some anxiety issues which are fairly well-controlled on sertraline. He has 5 daughters. Four of his own (2 marriages) and 1 stepdaughter. One of them has cystic fibrosis.    Never smoked. No regular alcohol use. Stays very busy with work  Has some nonspecific fatigue issues which are relatively mild. He has questions about checking testosterone level. This has never been checked previously. Good libido.  Reviewed with no major changes: Past Medical History:  Diagnosis Date  . Actinic keratosis 10/12/2009  . Acute prostatitis 05/24/2010  . Anal and rectal polyp 06/18/2010  . ANXIETY, CHRONIC 10/12/2009  . CARCINOMA, BASAL CELL 05/10/2009  . HYPERLIPIDEMIA 05/10/2009  . NONSPECIFIC ABN FINDING RAD & OTH EXAM GI TRACT 06/20/2010   Past Surgical History:  Procedure Laterality Date  . APPENDECTOMY  2003    reports that he has never smoked. He has never used smokeless tobacco. He reports that he drinks alcohol. He reports that he does not use drugs. family history includes Cancer in his mother and paternal grandfather; Diabetes in his father; Parkinsonism in his mother. No Known Allergies   Review of Systems  Constitutional: Positive for fatigue. Negative for activity change, appetite change and fever.  HENT: Negative for congestion, ear pain and trouble swallowing.   Eyes: Negative for pain and visual disturbance.  Respiratory: Negative for cough, shortness of breath and wheezing.   Cardiovascular: Negative for chest pain and palpitations.  Gastrointestinal: Negative for abdominal distention, abdominal pain, blood in stool, constipation, diarrhea, nausea, rectal pain and vomiting.  Genitourinary: Negative for dysuria, hematuria and testicular pain.  Musculoskeletal: Negative for arthralgias and joint swelling.  Skin:  Negative for rash.  Neurological: Negative for dizziness, syncope and headaches.  Hematological: Negative for adenopathy.  Psychiatric/Behavioral: Negative for confusion and dysphoric mood.       Objective:   Physical Exam  Constitutional: He is oriented to person, place, and time. He appears well-developed and well-nourished. No distress.  HENT:  Head: Normocephalic and atraumatic.  Right Ear: External ear normal.  Left Ear: External ear normal.  Mouth/Throat: Oropharynx is clear and moist.  Eyes: Pupils are equal, round, and reactive to light. Conjunctivae and EOM are normal.  Neck: Normal range of motion. Neck supple. No thyromegaly present.  Cardiovascular: Normal rate, regular rhythm and normal heart sounds.  No murmur heard. Pulmonary/Chest: No respiratory distress. He has no wheezes. He has no rales.  Abdominal: Soft. Bowel sounds are normal. He exhibits no distension and no mass. There is no tenderness. There is no rebound and no guarding.  Musculoskeletal: He exhibits no edema.  Lymphadenopathy:    He has no cervical adenopathy.  Neurological: He is alert and oriented to person, place, and time. He displays normal reflexes. No cranial nerve deficit.  Skin: No rash noted.  Psychiatric: He has a normal mood and affect.       Assessment:     Physical exam. The following issues were addressed    Plan:     -Tetanus booster given -Discussed shingles vaccine and he will consider. He will check with insurance coverage first -Obtain screening lab work -Patient requesting testosterone level -Monitor blood pressure closely and be in touch if consistently greater than 140/90  Eulas Post MD [facility] at [facility]

## 2018-05-10 NOTE — Patient Instructions (Signed)
Consider new shingles vaccine (Shingrix) and let us know if interested  Monitor blood pressure and be in touch if consistently > 140/90.

## 2018-05-10 NOTE — Addendum Note (Signed)
Addended by: Westley Hummer B on: 05/10/2018 12:13 PM   Modules accepted: Orders

## 2018-05-12 ENCOUNTER — Telehealth: Payer: Self-pay | Admitting: Family Medicine

## 2018-05-12 ENCOUNTER — Other Ambulatory Visit: Payer: Self-pay | Admitting: Family Medicine

## 2018-05-12 DIAGNOSIS — R7309 Other abnormal glucose: Secondary | ICD-10-CM

## 2018-05-12 DIAGNOSIS — E349 Endocrine disorder, unspecified: Secondary | ICD-10-CM

## 2018-05-12 NOTE — Telephone Encounter (Signed)
Copied from [place] [id number] [id number]. Topic: Quick Communication - See Telephone Encounter >> May 12, 2018  3:53 PM Cleaster Corin, NT wrote: CRM for notification. See Telephone encounter for: 05/12/18.  Pt. Calling stating that someone had called him to sched.l lab appt. But no orders or notes put in for pt.Pt. Would also like to have current results sent to mychart acct.

## 2018-05-12 NOTE — Telephone Encounter (Signed)
See lab result note.

## 2018-06-14 ENCOUNTER — Other Ambulatory Visit: Payer: Self-pay | Admitting: Family Medicine

## 2018-07-12 ENCOUNTER — Other Ambulatory Visit: Payer: Self-pay

## 2018-07-14 ENCOUNTER — Other Ambulatory Visit: Payer: Self-pay

## 2018-07-21 ENCOUNTER — Other Ambulatory Visit (INDEPENDENT_AMBULATORY_CARE_PROVIDER_SITE_OTHER): Payer: BC Managed Care – PPO

## 2018-07-21 DIAGNOSIS — E349 Endocrine disorder, unspecified: Secondary | ICD-10-CM

## 2018-07-21 DIAGNOSIS — R7309 Other abnormal glucose: Secondary | ICD-10-CM | POA: Diagnosis not present

## 2018-07-21 LAB — HEPATIC FUNCTION PANEL
ALT: 62 U/L — ABNORMAL HIGH (ref 0–53)
AST: 57 U/L — ABNORMAL HIGH (ref 0–37)
Albumin: 4.7 g/dL (ref 3.5–5.2)
Alkaline Phosphatase: 90 U/L (ref 39–117)
BILIRUBIN DIRECT: 0.2 mg/dL (ref 0.0–0.3)
BILIRUBIN TOTAL: 1.2 mg/dL (ref 0.2–1.2)
TOTAL PROTEIN: 7.8 g/dL (ref 6.0–8.3)

## 2018-07-21 LAB — TESTOSTERONE: Testosterone: 287.9 ng/dL — ABNORMAL LOW (ref 300.00–890.00)

## 2018-07-27 ENCOUNTER — Ambulatory Visit (INDEPENDENT_AMBULATORY_CARE_PROVIDER_SITE_OTHER): Payer: BC Managed Care – PPO | Admitting: Family Medicine

## 2018-07-27 ENCOUNTER — Encounter: Payer: Self-pay | Admitting: Family Medicine

## 2018-07-27 VITALS — BP 130/90 | HR 93 | Temp 97.7°F | Wt 195.3 lb

## 2018-07-27 DIAGNOSIS — R739 Hyperglycemia, unspecified: Secondary | ICD-10-CM | POA: Diagnosis not present

## 2018-07-27 DIAGNOSIS — R7401 Elevation of levels of liver transaminase levels: Secondary | ICD-10-CM

## 2018-07-27 DIAGNOSIS — R7989 Other specified abnormal findings of blood chemistry: Secondary | ICD-10-CM

## 2018-07-27 DIAGNOSIS — E785 Hyperlipidemia, unspecified: Secondary | ICD-10-CM

## 2018-07-27 DIAGNOSIS — R740 Nonspecific elevation of levels of transaminase and lactic acid dehydrogenase [LDH]: Secondary | ICD-10-CM

## 2018-07-27 NOTE — Progress Notes (Signed)
  Subjective:     Patient ID: [name], male   DOB: 12-18-1960, 57 y.o.   MRN: [id number]  HPI Patient for follow-up regarding liver transaminase elevations and low testosterone. He gained some weight and has already lost down about 8 or 10 pounds due to his efforts. He has scaled back alcohol consumption. Had been drinking about 2-4 beers per day. Also consuming some wine. He is exercising fairly regularly.  Recent glucose 104. He has a daughter with type 1 diabetes who has been monitoring blood sugars at home and since his weight loss, blood sugars consistently less than 100 fasting.  Initial enzymes AST 48 and ALT 79. After 2 months of weight loss his AST came back 57 and ALT 62. Initial testosterone 254 and repeat 287  No family history of hemochromatosis. Hepatitis C screening last year negative. No risk factors for hepatitis B  Past Medical History:  Diagnosis Date  . Actinic keratosis 10/12/2009  . Acute prostatitis 05/24/2010  . Anal and rectal polyp 06/18/2010  . ANXIETY, CHRONIC 10/12/2009  . CARCINOMA, BASAL CELL 05/10/2009  . HYPERLIPIDEMIA 05/10/2009  . NONSPECIFIC ABN FINDING RAD & OTH EXAM GI TRACT 06/20/2010   Past Surgical History:  Procedure Laterality Date  . APPENDECTOMY  2003    reports that he has never smoked. He has never used smokeless tobacco. He reports that he drinks alcohol. He reports that he does not use drugs. family history includes Cancer in his mother and paternal grandfather; Diabetes in his father; Parkinsonism in his mother. No Known Allergies   Review of Systems  Constitutional: Negative for fatigue and unexpected weight change.  Eyes: Negative for visual disturbance.  Respiratory: Negative for cough, chest tightness and shortness of breath.   Cardiovascular: Negative for chest pain, palpitations and leg swelling.  Endocrine: Negative for polydipsia and polyuria.  Neurological: Negative for dizziness, syncope, weakness, light-headedness and  headaches.       Objective:   Physical Exam  Constitutional: He appears well-developed and well-nourished.  Cardiovascular: Normal rate and regular rhythm.  Pulmonary/Chest: Effort normal and breath sounds normal.  Abdominal: Soft. Bowel sounds are normal. He exhibits no mass.       Assessment:     #1 mild elevated liver transaminases. Suspect combination of fatty liver and alcohol use. Recent ALTs slightly improved but AST slightly increased  #2 mild low testosterone  #3 mild hyperglycemia by recent labs with glucose 104.    Plan:     -We discussed further evaluation of liver transaminase elevations. We decided to give this another couple months of weight loss and reduced alcohol consumption and repeat. If not back to normal in a few months consider ultrasound and further lab evaluation -Continue to scale back alcohol use -Repeat total testosterone and lipids at follow-up in 2 months  Eulas Post MD [facility] at [facility]

## 2018-07-27 NOTE — Patient Instructions (Signed)
Keep up weight loss efforts and keep alcohol down  Let's plan repeat in 2 months.

## 2018-08-18 ENCOUNTER — Other Ambulatory Visit: Payer: Self-pay | Admitting: Family Medicine

## 2018-09-27 ENCOUNTER — Other Ambulatory Visit: Payer: BC Managed Care – PPO

## 2018-12-09 ENCOUNTER — Other Ambulatory Visit: Payer: Self-pay | Admitting: Family Medicine

## 2019-02-01 ENCOUNTER — Other Ambulatory Visit: Payer: Self-pay | Admitting: Family Medicine

## 2019-03-29 ENCOUNTER — Other Ambulatory Visit: Payer: Self-pay | Admitting: Family Medicine

## 2019-05-27 ENCOUNTER — Other Ambulatory Visit: Payer: Self-pay | Admitting: Family Medicine

## 2019-06-03 ENCOUNTER — Ambulatory Visit (INDEPENDENT_AMBULATORY_CARE_PROVIDER_SITE_OTHER): Payer: BC Managed Care – PPO | Admitting: Family Medicine

## 2019-06-03 ENCOUNTER — Other Ambulatory Visit: Payer: Self-pay

## 2019-06-03 DIAGNOSIS — F411 Generalized anxiety disorder: Secondary | ICD-10-CM | POA: Diagnosis not present

## 2019-06-03 MED ORDER — SERTRALINE HCL 50 MG PO TABS: 50.0000 mg | ORAL_TABLET | Freq: Every day | ORAL | 3 refills | Status: DC

## 2019-06-03 NOTE — Progress Notes (Signed)
Patient ID: [name], male   DOB: [birth year], 58 y.o.   MRN: [id number]  This visit type was conducted due to national recommendations for restrictions regarding the COVID-19 pandemic in an effort to limit this patient's exposure and mitigate transmission in our community.   Virtual Visit via Video Note  I connected with [name] on 06/03/19 at 11:45 AM EDT by a video enabled telemedicine application and verified that I am speaking with the correct person using two identifiers.  Location patient: home Location provider:work or home office Persons participating in the virtual visit: patient, provider  I discussed the limitations of evaluation and management by telemedicine and the availability of in person appointments. The patient expressed understanding and agreed to proceed.   HPI: Patient has longstanding history of anxiety.  He is currently on sertraline 50 mg once daily which seems to be controlling his symptoms fairly well.  He needs refills.  He is scheduled for physical for September.  No depression issues.  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, [place]   ROS: See pertinent positives and negatives per HPI.  Past Medical History:  Diagnosis Date  . Actinic keratosis 10/12/2009  . Acute prostatitis 05/24/2010  . Anal and rectal polyp 06/18/2010  . ANXIETY, CHRONIC 10/12/2009  . CARCINOMA, BASAL CELL 05/10/2009  . HYPERLIPIDEMIA 05/10/2009  . NONSPECIFIC ABN FINDING RAD & OTH EXAM GI TRACT 06/20/2010    Past Surgical History:  Procedure Laterality Date  . APPENDECTOMY  2003    Family History  Problem Relation Age of Onset  . Cancer Mother        colon  . Parkinsonism Mother   . Diabetes Father   . Cancer Paternal Grandfather        prostate  . Colon cancer Neg Hx     SOCIAL HX: Non-smoker   Current Outpatient Medications:  .  clonazePAM (KLONOPIN) 0.5 MG tablet, Take 1 tablet (0.5 mg total) by  mouth 2 (two) times daily as needed., Disp: 60 tablet, Rfl: 0 .  fluticasone (FLONASE) 50 MCG/ACT nasal spray, , Disp: , Rfl:  .  sertraline (ZOLOFT) 50 MG tablet, Take 1 tablet (50 mg total) by mouth daily., Disp: 90 tablet, Rfl: 3  EXAM:  VITALS per patient if applicable:  GENERAL: alert, oriented, appears well and in no acute distress  HEENT: atraumatic, conjunttiva clear, no obvious abnormalities on inspection of external nose and ears  NECK: normal movements of the head and neck  LUNGS: on inspection no signs of respiratory distress, breathing rate appears normal, no obvious gross SOB, gasping or wheezing  CV: no obvious cyanosis  MS: moves all visible extremities without noticeable abnormality  PSYCH/NEURO: pleasant and cooperative, no obvious depression or anxiety, speech and thought processing grossly intact  ASSESSMENT AND PLAN:  Discussed the following assessment and plan:  Anxiety state-stable on sertraline  -Refilled sertraline for 1 year -Physical has been scheduled for September     I discussed the assessment and treatment plan with the patient. The patient was provided an opportunity to ask questions and all were answered. The patient agreed with the plan and demonstrated an understanding of the instructions.   The patient was advised to call back or seek an in-person evaluation if the symptoms worsen or if the condition fails to improve as anticipated.   Carolann Littler, MD

## 2019-08-19 ENCOUNTER — Encounter: Payer: BC Managed Care – PPO | Admitting: Family Medicine

## 2019-09-07 ENCOUNTER — Encounter: Payer: Self-pay | Admitting: Family Medicine

## 2019-10-04 ENCOUNTER — Other Ambulatory Visit: Payer: Self-pay

## 2019-10-04 ENCOUNTER — Encounter: Payer: Self-pay | Admitting: Family Medicine

## 2019-10-04 ENCOUNTER — Ambulatory Visit (INDEPENDENT_AMBULATORY_CARE_PROVIDER_SITE_OTHER): Payer: BC Managed Care – PPO | Admitting: Family Medicine

## 2019-10-04 VITALS — BP 146/82 | HR 89 | Temp 97.4°F | Resp 16 | Ht 70.0 in | Wt 195.2 lb

## 2019-10-04 DIAGNOSIS — Z0000 Encounter for general adult medical examination without abnormal findings: Secondary | ICD-10-CM | POA: Diagnosis not present

## 2019-10-04 DIAGNOSIS — Z125 Encounter for screening for malignant neoplasm of prostate: Secondary | ICD-10-CM | POA: Diagnosis not present

## 2019-10-04 LAB — LIPID PANEL
Cholesterol: 275 mg/dL — ABNORMAL HIGH (ref 0–200)
HDL: 53.3 mg/dL (ref >39.00)
LDL Cholesterol: 183 mg/dL — ABNORMAL HIGH (ref 0–99)
NonHDL: 221.93
Total CHOL/HDL Ratio: 5
Triglycerides: 196 mg/dL — ABNORMAL HIGH (ref 0.0–149.0)
VLDL: 39.2 mg/dL (ref 0.0–40.0)

## 2019-10-04 LAB — HEPATIC FUNCTION PANEL
ALT: 64 U/L — ABNORMAL HIGH (ref 0–53)
AST: 51 U/L — ABNORMAL HIGH (ref 0–37)
Albumin: 4.8 g/dL (ref 3.5–5.2)
Alkaline Phosphatase: 87 U/L (ref 39–117)
Bilirubin, Direct: 0.1 mg/dL (ref 0.0–0.3)
Total Bilirubin: 0.7 mg/dL (ref 0.2–1.2)
Total Protein: 7.5 g/dL (ref 6.0–8.3)

## 2019-10-04 LAB — BASIC METABOLIC PANEL
BUN: 8 mg/dL (ref 6–23)
CO2: 29 mEq/L (ref 19–32)
Calcium: 9.6 mg/dL (ref 8.4–10.5)
Chloride: 104 mEq/L (ref 96–112)
Creatinine, Ser: 0.79 mg/dL (ref 0.40–1.50)
GFR: 101.1 mL/min (ref >60.00)
Glucose, Bld: 105 mg/dL — ABNORMAL HIGH (ref 70–99)
Potassium: 4.3 mEq/L (ref 3.5–5.1)
Sodium: 138 mEq/L (ref 135–145)

## 2019-10-04 LAB — CBC WITH DIFFERENTIAL/PLATELET
Basophils Absolute: 0.1 10*3/uL (ref 0.0–0.1)
Basophils Relative: 1 % (ref 0.0–3.0)
Eosinophils Absolute: 0.1 10*3/uL (ref 0.0–0.7)
Eosinophils Relative: 2.4 % (ref 0.0–5.0)
HCT: 44.8 % (ref 39.0–52.0)
Hemoglobin: 15.4 g/dL (ref 13.0–17.0)
Lymphocytes Relative: 36.8 % (ref 12.0–46.0)
Lymphs Abs: 2.1 10*3/uL (ref 0.7–4.0)
MCHC: 34.4 g/dL (ref 30.0–36.0)
MCV: 96.5 fl (ref 78.0–100.0)
Monocytes Absolute: 0.5 10*3/uL (ref 0.1–1.0)
Monocytes Relative: 8.6 % (ref 3.0–12.0)
Neutro Abs: 2.9 10*3/uL (ref 1.4–7.7)
Neutrophils Relative %: 51.2 % (ref 43.0–77.0)
Platelets: 265 10*3/uL (ref 150.0–400.0)
RBC: 4.64 Mil/uL (ref 4.22–5.81)
RDW: 12.9 % (ref 11.5–15.5)
WBC: 5.7 10*3/uL (ref 4.0–10.5)

## 2019-10-04 NOTE — Progress Notes (Signed)
Subjective:     Patient ID: [name], male   DOB: 07-04-1961, 58 y.o.   MRN: [id number]  HPI  [name] is here for physical exam.  Generally very healthy.  He has history of some chronic anxiety symptoms which are stable on sertraline.  Did have mildly elevated liver transaminases last year.  Does not drink alcohol regularly.  He is very active with work.  No recent chest pains, dizziness, abdominal pain.  No history of shingles vaccine.  Flu vaccine already given.  Hepatitis C screen -2 years ago.  Tetanus up-to-date.  Colonoscopy due 2021  Past Medical History:  Diagnosis Date  . Actinic keratosis 10/12/2009  . Acute prostatitis 05/24/2010  . Anal and rectal polyp 06/18/2010  . ANXIETY, CHRONIC 10/12/2009  . CARCINOMA, BASAL CELL 05/10/2009  . HYPERLIPIDEMIA 05/10/2009  . NONSPECIFIC ABN FINDING RAD & OTH EXAM GI TRACT 06/20/2010   Past Surgical History:  Procedure Laterality Date  . APPENDECTOMY  2003    reports that he has never smoked. He has never used smokeless tobacco. He reports current alcohol use. He reports that he does not use drugs. family history includes Cancer in his mother and paternal grandfather; Diabetes in his father; Parkinsonism in his mother. No Known Allergies  Review of Systems  Constitutional: Negative for activity change, appetite change, fatigue and fever.  HENT: Negative for congestion, ear pain and trouble swallowing.   Eyes: Negative for pain and visual disturbance.  Respiratory: Negative for cough, shortness of breath and wheezing.   Cardiovascular: Negative for chest pain and palpitations.  Gastrointestinal: Negative for abdominal distention, abdominal pain, blood in stool, constipation, diarrhea, nausea, rectal pain and vomiting.  Genitourinary: Negative for dysuria, hematuria and testicular pain.  Musculoskeletal: Negative for arthralgias and joint swelling.  Skin: Negative for rash.  Neurological: Negative for dizziness, syncope and headaches.   Hematological: Negative for adenopathy.  Psychiatric/Behavioral: Negative for confusion and dysphoric mood.       Objective:   Physical Exam Constitutional:      General: He is not in acute distress.    Appearance: He is well-developed.  HENT:     Head: Normocephalic and atraumatic.     Right Ear: External ear normal.     Left Ear: External ear normal.  Eyes:     Conjunctiva/sclera: Conjunctivae normal.     Pupils: Pupils are equal, round, and reactive to light.  Neck:     Musculoskeletal: Normal range of motion and neck supple.     Thyroid: No thyromegaly.  Cardiovascular:     Rate and Rhythm: Normal rate and regular rhythm.     Heart sounds: Normal heart sounds. No murmur.  Pulmonary:     Effort: No respiratory distress.     Breath sounds: No wheezing or rales.  Abdominal:     General: Bowel sounds are normal. There is no distension.     Palpations: Abdomen is soft. There is no mass.     Tenderness: There is no abdominal tenderness. There is no guarding or rebound.  Lymphadenopathy:     Cervical: No cervical adenopathy.  Skin:    Findings: No rash.  Neurological:     Mental Status: He is alert and oriented to person, place, and time.     Cranial Nerves: No cranial nerve deficit.     Deep Tendon Reflexes: Reflexes normal.        Assessment:     Physical exam.  He has history of dyslipidemia but overall fairly  low risk for CAD.  We discussed the following health maintenance issues    Plan:     -Continue with yearly flu vaccine -Discussed Shingrix vaccine and he will check on insurance coverage -Obtain follow-up labs.  Patient requesting repeat testosterone level.  He does have some occasional fatigue issues.  He is undecided whether he would want to pursue replacement if this remains low  Eulas Post MD [facility] at [facility]

## 2019-10-04 NOTE — Patient Instructions (Signed)
Consider shingles vaccine (Shingrix) and check with insurance if interested.

## 2019-10-06 LAB — TESTOSTERONE: Testosterone: 331.13 ng/dL (ref 300.00–890.00)

## 2019-10-06 LAB — PSA: PSA: 0.48 ng/mL (ref 0.10–4.00)

## 2019-10-06 LAB — TSH: TSH: 1.89 u[IU]/mL (ref 0.35–4.50)

## 2020-08-27 ENCOUNTER — Other Ambulatory Visit: Payer: Self-pay | Admitting: Family Medicine

## 2020-09-03 DIAGNOSIS — D225 Melanocytic nevi of trunk: Secondary | ICD-10-CM | POA: Diagnosis not present

## 2020-09-03 DIAGNOSIS — L821 Other seborrheic keratosis: Secondary | ICD-10-CM | POA: Diagnosis not present

## 2020-09-03 DIAGNOSIS — Z1283 Encounter for screening for malignant neoplasm of skin: Secondary | ICD-10-CM | POA: Diagnosis not present

## 2020-09-21 ENCOUNTER — Other Ambulatory Visit: Payer: Self-pay | Admitting: Family Medicine

## 2021-03-07 ENCOUNTER — Other Ambulatory Visit: Payer: Self-pay | Admitting: Family Medicine

## 2021-03-09 ENCOUNTER — Other Ambulatory Visit: Payer: Self-pay | Admitting: Family Medicine

## 2021-03-15 ENCOUNTER — Encounter: Payer: Self-pay | Admitting: Internal Medicine

## 2021-07-03 ENCOUNTER — Other Ambulatory Visit: Payer: Self-pay | Admitting: Family Medicine

## 2021-10-21 ENCOUNTER — Other Ambulatory Visit: Payer: Self-pay | Admitting: Family Medicine

## 2021-10-22 ENCOUNTER — Telehealth (INDEPENDENT_AMBULATORY_CARE_PROVIDER_SITE_OTHER): Payer: BC Managed Care – PPO | Admitting: Family Medicine

## 2021-10-22 DIAGNOSIS — F411 Generalized anxiety disorder: Secondary | ICD-10-CM | POA: Diagnosis not present

## 2021-10-22 MED ORDER — SERTRALINE HCL 50 MG PO TABS: 50.0000 mg | ORAL_TABLET | Freq: Every day | ORAL | 3 refills | Status: DC

## 2021-10-22 NOTE — Progress Notes (Signed)
Patient ID: [name], male   DOB: 08-11-1961, 60 y.o.   MRN: [id number]   This visit type was conducted due to national recommendations for restrictions regarding the COVID-19 pandemic in an effort to limit this patient's exposure and mitigate transmission in our community.   Virtual Visit via Video Note  I connected with [name] on 10/22/21 at  3:00 PM EST by a video enabled telemedicine application and verified that I am speaking with the correct person using two identifiers.  Location patient: home Location provider:work or home office Persons participating in the virtual visit: patient, provider  I discussed the limitations of evaluation and management by telemedicine and the availability of in person appointments. The patient expressed understanding and agreed to proceed.   HPI:  [name] a call basically requesting medication refill.  Not seen since last physical 10/20.  He has scheduled repeat physical for this coming February.  He has history of some chronic anxiety and has been on sertraline 50 mg daily for several years.  This seems to be working well.  Has taken low-dose Klonopin in the past as needed but infrequently.  No depression symptoms.  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  No specific complaints today.    ROS: See pertinent positives and negatives per HPI.  Past Medical History:  Diagnosis Date   Actinic keratosis 10/12/2009   Acute prostatitis 05/24/2010   Anal and rectal polyp 06/18/2010   ANXIETY, CHRONIC 10/12/2009   CARCINOMA, BASAL CELL 05/10/2009   HYPERLIPIDEMIA 05/10/2009   NONSPECIFIC ABN FINDING RAD & OTH EXAM GI TRACT 06/20/2010    Past Surgical History:  Procedure Laterality Date   APPENDECTOMY  2003    Family History  Problem Relation Age of Onset   Cancer Mother        colon   Parkinsonism Mother    Diabetes Father    Cancer Paternal Grandfather        prostate   Colon cancer Neg Hx     SOCIAL HX: Non-smoker   Current Outpatient  Medications:    clonazePAM (KLONOPIN) 0.5 MG tablet, Take 1 tablet (0.5 mg total) by mouth 2 (two) times daily as needed., Disp: 60 tablet, Rfl: 0   fluticasone (FLONASE) 50 MCG/ACT nasal spray, , Disp: , Rfl:    sertraline (ZOLOFT) 50 MG tablet, Take 1 tablet (50 mg total) by mouth daily., Disp: 90 tablet, Rfl: 3  EXAM:  VITALS per patient if applicable:  GENERAL: alert, oriented, appears well and in no acute distress  HEENT: atraumatic, conjunttiva clear, no obvious abnormalities on inspection of external nose and ears  NECK: normal movements of the head and neck  LUNGS: on inspection no signs of respiratory distress, breathing rate appears normal, no obvious gross SOB, gasping or wheezing  CV: no obvious cyanosis  MS: moves all visible extremities without noticeable abnormality  PSYCH/NEURO: pleasant and cooperative, no obvious depression or anxiety, speech and thought processing grossly intact  ASSESSMENT AND PLAN:  Discussed the following assessment and plan:  Chronic anxiety symptoms stable on sertraline 50 mg daily -Refill medication for 1 year -Complete physical has been scheduled for February.  We will plan to get some follow-up labs then and schedule repeat colonoscopy.     I discussed the assessment and treatment plan with the patient. The patient was provided an opportunity to ask questions and all were answered. The patient agreed with the plan and demonstrated an understanding of the instructions.   The patient was  advised to call back or seek an in-person evaluation if the symptoms worsen or if the condition fails to improve as anticipated.     Carolann Littler, MD

## 2021-12-30 DIAGNOSIS — M5412 Radiculopathy, cervical region: Secondary | ICD-10-CM | POA: Diagnosis not present

## 2022-01-14 ENCOUNTER — Encounter: Payer: BC Managed Care – PPO | Admitting: Family Medicine

## 2022-02-04 ENCOUNTER — Encounter: Payer: BC Managed Care – PPO | Admitting: Family Medicine

## 2022-03-26 ENCOUNTER — Encounter: Payer: BC Managed Care – PPO | Admitting: Family Medicine

## 2022-12-30 ENCOUNTER — Other Ambulatory Visit: Payer: Self-pay | Admitting: Family Medicine

## 2023-02-04 ENCOUNTER — Other Ambulatory Visit: Payer: Self-pay | Admitting: Family Medicine

## 2023-02-04 ENCOUNTER — Telehealth: Payer: Self-pay | Admitting: Family Medicine

## 2023-02-04 NOTE — Telephone Encounter (Signed)
Pt called and sched an appt on Fri for a med refill Rx sertraline (ZOLOFT) 50 MG tablet however pt is wondering if he can last until Fri without the taking the meds since he is completely out.   Please advise.

## 2023-02-05 MED ORDER — SERTRALINE HCL 50 MG PO TABS: 50.0000 mg | ORAL_TABLET | Freq: Every day | ORAL | 0 refills | Status: DC

## 2023-02-05 NOTE — Telephone Encounter (Signed)
30 day supply sent until patient's appointment. Patient is aware

## 2023-02-06 ENCOUNTER — Ambulatory Visit (INDEPENDENT_AMBULATORY_CARE_PROVIDER_SITE_OTHER): Payer: BC Managed Care – PPO | Admitting: Family Medicine

## 2023-02-06 VITALS — BP 118/94 | HR 90 | Temp 98.0°F | Ht 70.0 in | Wt 194.9 lb

## 2023-02-06 DIAGNOSIS — R030 Elevated blood-pressure reading, without diagnosis of hypertension: Secondary | ICD-10-CM

## 2023-02-06 DIAGNOSIS — F411 Generalized anxiety disorder: Secondary | ICD-10-CM

## 2023-02-06 MED ORDER — SERTRALINE HCL 50 MG PO TABS: 50.0000 mg | ORAL_TABLET | Freq: Every day | ORAL | 3 refills | Status: DC

## 2023-02-06 NOTE — Patient Instructions (Signed)
Set up complete physical  Monitor blood pressure at home and bring in your cuff at physical to compare with ours.

## 2023-02-06 NOTE — Progress Notes (Signed)
Established Patient Office Visit  Subjective   Patient ID: [name], male    DOB: [birth year]  Age: 62 y.o. MRN: [id number]  Chief Complaint  Patient presents with   Medication Refill    HPI   [name] is seen today requesting medication refill.  He has been on sertraline for several years for chronic anxiety symptoms and this is stable.  Takes sertraline 50 mg once daily.  Has taken low-dose Klonopin in the past as needed but infrequently.  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  Has not had physical or labs in quite some time.  Blood pressure was quite elevated today but he has no history of hypertension.  He states he has had "whitecoat syndrome "in the past.  Denies any headaches.  No recent chest pains.  No peripheral edema.  No regular alcohol use.  Past Medical History:  Diagnosis Date   Actinic keratosis 10/12/2009   Acute prostatitis 05/24/2010   Anal and rectal polyp 06/18/2010   ANXIETY, CHRONIC 10/12/2009   CARCINOMA, BASAL CELL 05/10/2009   HYPERLIPIDEMIA 05/10/2009   NONSPECIFIC ABN FINDING RAD & OTH EXAM GI TRACT 06/20/2010   Past Surgical History:  Procedure Laterality Date   APPENDECTOMY  2003    reports that he has never smoked. He has never used smokeless tobacco. He reports current alcohol use. He reports that he does not use drugs. family history includes Cancer in his mother and paternal grandfather; Diabetes in his father; Parkinsonism in his mother. No Known Allergies  Review of Systems  Constitutional:  Negative for malaise/fatigue.  Eyes:  Negative for blurred vision.  Respiratory:  Negative for shortness of breath.   Cardiovascular:  Negative for chest pain.  Neurological:  Negative for dizziness, weakness and headaches.      Objective:     BP (!) 118/94 (BP Location: Left Arm, Patient Position: Sitting, Cuff Size: Normal)   Pulse 90   Temp 98 F (36.7 C)  (Oral)   Ht '5\' 10"'$  (1.778 m)   Wt 194 lb 14.4 oz (88.4 kg)   SpO2 99%   BMI 27.97 kg/m  BP Readings from Last 3 Encounters:  02/06/23 (!) 118/94  10/04/19 (!) 146/82  07/27/18 130/90   Wt Readings from Last 3 Encounters:  02/06/23 194 lb 14.4 oz (88.4 kg)  10/04/19 195 lb 3.2 oz (88.5 kg)  07/27/18 195 lb 4.8 oz (88.6 kg)      Physical Exam Vitals reviewed.  Constitutional:      Appearance: He is well-developed.  Eyes:     Pupils: Pupils are equal, round, and reactive to light.  Neck:     Thyroid: No thyromegaly.  Cardiovascular:     Rate and Rhythm: Normal rate and regular rhythm.  Pulmonary:     Effort: Pulmonary effort is normal. No respiratory distress.     Breath sounds: Normal breath sounds. No wheezing or rales.  Musculoskeletal:     Cervical back: Neck supple.     Right lower leg: No edema.     Left lower leg: No edema.  Neurological:     Mental Status: He is alert and oriented to person, place, and time.      No results found for any visits on 02/06/23.    The ASCVD Risk score (Arnett DK, et al., 2019) failed to calculate for the following reasons:   Cannot find a previous  HDL lab   Cannot find a previous total cholesterol lab    Assessment & Plan:   #1 chronic anxiety symptoms.  Stable on sertraline.  Refill sertraline 50 mg daily for 1 year  #2 elevated blood pressure without diagnosis of hypertension.  This is quite elevated today.  We discussed starting medication but he is somewhat reluctant.  Would like to schedule physical in 2 to 3 weeks and recheck at that point.  He also agrees to check at home in the meantime and if still up at that point definitely need to initiate medication.  Handout on Dash eating plan given  Carolann Littler, MD

## 2023-02-27 ENCOUNTER — Encounter: Payer: BC Managed Care – PPO | Admitting: Family Medicine

## 2023-05-25 ENCOUNTER — Encounter: Payer: BC Managed Care – PPO | Admitting: Family Medicine

## 2023-07-08 ENCOUNTER — Encounter: Payer: BC Managed Care – PPO | Admitting: Family Medicine

## 2023-07-23 ENCOUNTER — Encounter (INDEPENDENT_AMBULATORY_CARE_PROVIDER_SITE_OTHER): Payer: Self-pay

## 2023-08-24 ENCOUNTER — Encounter: Payer: BC Managed Care – PPO | Admitting: Family Medicine

## 2023-09-29 ENCOUNTER — Encounter: Payer: BC Managed Care – PPO | Admitting: Family Medicine

## 2024-06-03 ENCOUNTER — Other Ambulatory Visit: Payer: Self-pay | Admitting: Family Medicine

## 2024-08-26 ENCOUNTER — Other Ambulatory Visit: Payer: Self-pay | Admitting: Family Medicine

## 2024-09-06 DIAGNOSIS — L820 Inflamed seborrheic keratosis: Secondary | ICD-10-CM | POA: Diagnosis not present

## 2024-09-06 DIAGNOSIS — D225 Melanocytic nevi of trunk: Secondary | ICD-10-CM | POA: Diagnosis not present

## 2024-09-06 DIAGNOSIS — Z1283 Encounter for screening for malignant neoplasm of skin: Secondary | ICD-10-CM | POA: Diagnosis not present

## 2024-09-06 DIAGNOSIS — B078 Other viral warts: Secondary | ICD-10-CM | POA: Diagnosis not present

## 2024-11-09 ENCOUNTER — Other Ambulatory Visit: Payer: Self-pay | Admitting: Family Medicine

## 2024-12-30 ENCOUNTER — Other Ambulatory Visit: Payer: Self-pay | Admitting: Family Medicine
